# Patient Record
Sex: Male | Born: 1985 | Race: Black or African American | Hispanic: No | Marital: Single | Smoking: Current every day smoker
Health system: Southern US, Community
[De-identification: ages and names within clinical notes are randomized; demographics above are authoritative.]

## PROBLEM LIST (undated history)

## (undated) DIAGNOSIS — G809 Cerebral palsy, unspecified: Secondary | ICD-10-CM

## (undated) DIAGNOSIS — I639 Cerebral infarction, unspecified: Secondary | ICD-10-CM

## (undated) DIAGNOSIS — I10 Essential (primary) hypertension: Secondary | ICD-10-CM

## (undated) DIAGNOSIS — F32A Depression, unspecified: Secondary | ICD-10-CM

## (undated) DIAGNOSIS — F329 Major depressive disorder, single episode, unspecified: Secondary | ICD-10-CM

---
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
## 2006-05-18 ENCOUNTER — Emergency Department (HOSPITAL_COMMUNITY): Admission: EM | Admit: 2006-05-18 | Discharge: 2006-05-18 | Payer: Self-pay | Admitting: Emergency Medicine

## 2007-08-10 ENCOUNTER — Emergency Department (HOSPITAL_COMMUNITY): Admission: EM | Admit: 2007-08-10 | Discharge: 2007-08-10 | Payer: Self-pay | Admitting: Emergency Medicine

## 2008-10-24 ENCOUNTER — Emergency Department (HOSPITAL_COMMUNITY): Admission: EM | Admit: 2008-10-24 | Discharge: 2008-10-24 | Payer: Self-pay | Admitting: Emergency Medicine

## 2008-10-26 ENCOUNTER — Inpatient Hospital Stay (HOSPITAL_COMMUNITY): Admission: EM | Admit: 2008-10-26 | Discharge: 2008-10-28 | Payer: Self-pay | Admitting: Emergency Medicine

## 2009-02-13 ENCOUNTER — Emergency Department (HOSPITAL_COMMUNITY): Admission: EM | Admit: 2009-02-13 | Discharge: 2009-02-13 | Payer: Self-pay | Admitting: Emergency Medicine

## 2009-04-13 ENCOUNTER — Emergency Department (HOSPITAL_COMMUNITY): Admission: EM | Admit: 2009-04-13 | Discharge: 2009-04-13 | Payer: Self-pay | Admitting: Emergency Medicine

## 2010-07-07 LAB — COMPREHENSIVE METABOLIC PANEL
ALT: 40 U/L (ref 0–53)
Alkaline Phosphatase: 57 U/L (ref 39–117)
CO2: 25 mEq/L (ref 19–32)
Calcium: 9.3 mg/dL (ref 8.4–10.5)
Chloride: 108 mEq/L (ref 96–112)
GFR calc non Af Amer: >60 mL/min (ref >60)
Glucose, Bld: 205 mg/dL — ABNORMAL HIGH (ref 70–99)
Potassium: 4.2 mEq/L (ref 3.5–5.1)
Sodium: 141 mEq/L (ref 135–145)
Total Bilirubin: 0.7 mg/dL (ref 0.3–1.2)

## 2010-07-07 LAB — CBC
Hemoglobin: 14.1 g/dL (ref 13.0–17.0)
Hemoglobin: 14.1 g/dL (ref 13.0–17.0)
MCHC: 32.7 g/dL (ref 30.0–36.0)
MCHC: 33.6 g/dL (ref 30.0–36.0)
RBC: 4.95 MIL/uL (ref 4.22–5.81)
RBC: 5.02 MIL/uL (ref 4.22–5.81)
RBC: 5.2 MIL/uL (ref 4.22–5.81)
RDW: 15.2 % (ref 11.5–15.5)
WBC: 11 10*3/uL — ABNORMAL HIGH (ref 4.0–10.5)
WBC: 16.3 10*3/uL — ABNORMAL HIGH (ref 4.0–10.5)

## 2010-07-07 LAB — URINALYSIS, ROUTINE W REFLEX MICROSCOPIC
Bilirubin Urine: NEGATIVE
Hgb urine dipstick: NEGATIVE
Ketones, ur: NEGATIVE mg/dL
Protein, ur: NEGATIVE mg/dL
Urobilinogen, UA: 1 mg/dL (ref 0.0–1.0)

## 2010-07-07 LAB — SEDIMENTATION RATE: Sed Rate: 5 mm/hr (ref 0–16)

## 2010-07-07 LAB — RHEUMATOID FACTOR: Rhuematoid fact SerPl-aCnc: <20 IU/mL (ref 0–20)

## 2010-07-07 LAB — LACTATE DEHYDROGENASE: LDH: 143 U/L (ref 94–250)

## 2010-07-07 LAB — DIFFERENTIAL
Basophils Relative: 0 % (ref 0–1)
Monocytes Relative: 4 % (ref 3–12)
Neutro Abs: 8 10*3/uL — ABNORMAL HIGH (ref 1.7–7.7)
Neutrophils Relative %: 73 % (ref 43–77)

## 2010-07-07 LAB — MAGNESIUM: Magnesium: 2 mg/dL (ref 1.5–2.5)

## 2010-08-13 NOTE — H&P (Signed)
Troy Phillips, WILDEN NO.:  [id number]   MEDICAL RECORD NO.:  [id number]          PATIENT TYPE:  INP   LOCATION:  1318                         FACILITY:  [facility]   PHYSICIAN:  Joylene John, MD       DATE OF BIRTH:  21-Apr-1985   DATE OF ADMISSION:  10/26/2008  DATE OF DISCHARGE:                              HISTORY & PHYSICAL   REASON FOR ADMISSION:  Facial swelling.   HISTORY OF PRESENT ILLNESS:  This is a 25 year old African American male  who came to the ER two days ago on October 19, 2008, with similar  complaints with right-sided facial swelling which started suddenly.  The  patient was treated as an allergic reaction, given steroids and  Benadryl, and discharged home.  The patient was told to come back if the  symptoms got worse.  The patient tells me that he has been taking the  medication.  However, the symptoms have gotten worse and he has had no  relief.  According to the patient and the mother, the swelling has  actually spread forward from the right side of the face to the left side  and the right side is almost shut.  He also has been developing hives  which have started as of today of the torso.  The patient denies any new  contacts to allergens such as soap, detergent, or perfumes.  He has had  no recent travels, no new clothes, and no new sheets.  He does have a  girlfriend who also has not had any new allergens such as perfumes or  detergents.  He has not had any similar episodes in the past.  He does  complain of associated burning and itching.  According to the mother,  the girlfriend has noticed that he has been having night sweats for the  last 3-4 days.  The patient denies any chills, nausea, or vomiting.  He  does have asthma.  However, per the mother, he does not take medication.  The patient has some mild shortness of breath due to his asthma.  GI:  He has no problems swallowing and no problems with bowel movements.   PAST MEDICAL HISTORY:  1. Cerebral palsy.  2. Asthma.  3. Hypertension.   FAMILY HISTORY:  No family history of allergic reactions.   SOCIAL HISTORY:  He lives with his mother.  He has a girlfriend.  No  alcohol, drugs, or tobacco.   LABORATORY DATA:  Only a CBC was done in the ER which shows a white  count of 11, hemoglobin 14.8, hematocrit 44, platelets 274,000.  No  chemistry or chest x-ray were done in the ER.   PHYSICAL EXAMINATION:  VITAL SIGNS:  Temperature of 97.4, pulse 77,  respirations 20, blood pressure 140/92, and the patient is saturating  98% on room air.  GENERAL:  This is an [place] American male in no acute distress, awake  and alert, family at bedside.  HEENT: He does have the right side of the face which is swollen with his  right eye almost shut.  He does not  have any corneal involvement.  Rash  is spreading to his left side of the face.  He also has hives on the  torso.  CARDIOVASCULAR:  Regular rate and rhythm.  LUNGS:  He has bilateral expiratory wheezes.  LOWER EXTREMITIES: No edema appreciated.   ASSESSMENT AND PLAN:  The plan is to admit the patient and put him on  high-dose steroids for 24 hours and then reevaluate by the MD in the  morning.  Will also put him on p.r.n. Benadryl and Atarax for his  burning and  itching.  We will put him on H2 blocker and repeat the labs in the  morning including a chemistry and get a chest x-ray in the morning as  well.  If there is no improvement then to consider dermatology consult  and evaluation in the morning.      Joylene John, MD  Electronically Signed     RP/MEDQ  D:  10/26/2008  T:  10/27/2008  Job:  [id number]

## 2010-08-13 NOTE — Discharge Summary (Signed)
Troy Phillips, Troy Phillips NO.:  [id number]   MEDICAL RECORD NO.:  [id number]          PATIENT TYPE:  INP   LOCATION:  1318                         FACILITY:  [facility]   PHYSICIAN:  Hillery Aldo, M.D.   DATE OF BIRTH:  22-Nov-1985   DATE OF ADMISSION:  10/26/2008  DATE OF DISCHARGE:  10/28/2008                               DISCHARGE SUMMARY   PRIMARY CARE PHYSICIAN:  Lonia Blood, M.D.   ALLERGIST:  None.   The patient will be referred to [name], M.D. for hospital  followup.   DISCHARGE DIAGNOSES:  1. Angioedema.  2. Hives.  3. Cerebral palsy.  4. History of asthma.  5. History of hypertension.  6. Steroid-induced hyperglycemia.  7. Leukocytosis, likely steroid induced.   DISCHARGE MEDICATIONS:  1. Prednisone 60 mg daily.  2. Zyrtec 10 mg daily.  3. Pepcid 20 mg b.i.d.  4. Epipen p.r.n.  5. Benadryl cream to hives q.4-6 h. p.r.n. pruritus.   CONSULTATIONS:  None.   BRIEF ADMISSION HISTORY OF PRESENT ILLNESS:  The patient is a 25-year-  old male who came to the emergency department on October 24, 2008 with a  chief complaint of facial swelling.  He was given steroids and  discharged home on prednisone.  Despite taking these medications, had no  relief of symptoms and subsequently represented to the hospital on October 26, 2008 where he was referred to the Hospitalist Service for admission.  The patient had no new contacts with any environmental allergens.  He  has no known drug allergies.  He subsequently was admitted for high-dose  IV Solu-Medrol.  For the full details, please see the dictated report  done by Dr. Allena Katz.   PROCEDURES/DIAGNOSTIC STUDIES:  Chest x-ray on October 26, 2008 showed no  acute cardiopulmonary disease.   LABORATORY DATA:  Discharge laboratory values:  CRP was 0.1.  ESR was 5.  White blood cell count was 19.5, hemoglobin 14.1, hematocrit 42.2,  platelets 300.  Chemistries on admission were unremarkable with the  exception of an  elevated glucose.   HOSPITAL COURSE:  1. Angioedema with hives:  The patient clearly has had an allergic      reaction to an environmental antigen of unknown etiology.  Because      of the severity of his reaction, the patient will be maintained on      prednisone until he follows up with an allergist.  He will also be      prescribed Zyrtec and Pepcid as well as an EpiPen to use if he has      any exacerbation of symptoms with strict instructions that if he      has to use the EpiPen, that he should return to the emergency      department or call 911 immediately.  The patient verbalized      understanding of these instructions.  The patient's facial swelling      has responded to high dose Solu-Medrol.  He will be discharged on      prednisone as noted above.  The patient is further instructed to  avoid going outdoors until his symptoms completely resolve given      that he thinks going outdoors has made his symptoms worse.  2. Cerebral palsy:  No active issues during this hospital stay.  3. History of asthma:  The patient's lung sounds are clear.  4. History of hypertension:  The patient is not currently on any      medications.  He should follow up with Dr. [name] for routine      surveillance and consideration of treatment if indicated.  5. Steroid-induced hyperglycemia:  This should resolve with      discontinuation of steroids.  6. Leukocytosis:  This is most clearly steroid-induced.  His white      blood cell count was 11 on original presentation.  The patient does      not have any signs or symptoms of active infection.   DISPOSITION:  The patient is medically stable and will be discharged  home.   FOLLOW UP:  He is encouraged to follow up with his primary care  physician as well as with Dr. [name] next week.  The telephone number to  Dr. [name] office has been provided to the patient.   Time spent coordinating care for discharge and discharge instructions  equals 35  minutes.      Hillery Aldo, M.D.  Electronically Signed     CR/MEDQ  D:  10/28/2008  T:  10/28/2008  Job:  [id number]   cc:   Lonia Blood, M.D.   [name], M.D. [facility]  [address]  [place]  [place] [address]

## 2011-02-13 ENCOUNTER — Emergency Department (HOSPITAL_COMMUNITY)
Admission: EM | Admit: 2011-02-13 | Discharge: 2011-02-13 | Disposition: A | Discharge status: Home / Self Care | Payer: Medicaid Other | Attending: Emergency Medicine | Admitting: Emergency Medicine

## 2011-02-13 ENCOUNTER — Encounter: Payer: Self-pay | Admitting: *Deleted

## 2011-02-13 ENCOUNTER — Emergency Department (HOSPITAL_COMMUNITY): Payer: Medicaid Other

## 2011-02-13 DIAGNOSIS — S99921A Unspecified injury of right foot, initial encounter: Secondary | ICD-10-CM

## 2011-02-13 DIAGNOSIS — M79609 Pain in unspecified limb: Secondary | ICD-10-CM | POA: Insufficient documentation

## 2011-02-13 DIAGNOSIS — Z79899 Other long term (current) drug therapy: Secondary | ICD-10-CM | POA: Insufficient documentation

## 2011-02-13 DIAGNOSIS — S99929A Unspecified injury of unspecified foot, initial encounter: Secondary | ICD-10-CM | POA: Insufficient documentation

## 2011-02-13 DIAGNOSIS — I10 Essential (primary) hypertension: Secondary | ICD-10-CM | POA: Insufficient documentation

## 2011-02-13 DIAGNOSIS — S8990XA Unspecified injury of unspecified lower leg, initial encounter: Secondary | ICD-10-CM | POA: Insufficient documentation

## 2011-02-13 DIAGNOSIS — J45909 Unspecified asthma, uncomplicated: Secondary | ICD-10-CM | POA: Insufficient documentation

## 2011-02-13 HISTORY — DX: Essential (primary) hypertension: I10

## 2011-02-13 MED ORDER — OXYCODONE-ACETAMINOPHEN 5-325 MG PO TABS
1.0000 | ORAL_TABLET | Freq: Once | ORAL | Status: AC
  Administered 2011-02-13: 1 via ORAL
  Filled 2011-02-13: qty 1

## 2011-02-13 MED ORDER — IBUPROFEN 800 MG PO TABS
800.0000 mg | ORAL_TABLET | Freq: Once | ORAL | Status: AC
  Administered 2011-02-13: 800 mg via ORAL
  Filled 2011-02-13: qty 1

## 2011-02-13 MED ORDER — OXYCODONE-ACETAMINOPHEN 5-325 MG PO TABS: 1.0000 | ORAL_TABLET | ORAL | Status: AC | PRN

## 2011-02-13 MED ORDER — IBUPROFEN 600 MG PO TABS: 600.0000 mg | ORAL_TABLET | Freq: Four times a day (QID) | ORAL | Status: AC | PRN

## 2011-02-13 NOTE — Progress Notes (Signed)
Orthopedic Tech Progress Note Patient Details:  Troy Phillips November 11, 1985 [id number]  Other Ortho Devices Type of Ortho Device: Postop boot Ortho Device Location: (R) LE Ortho Device Interventions: Application   Jennye Moccasin 02/13/2011, 9:45 PM

## 2011-02-13 NOTE — ED Provider Notes (Signed)
Medical screening examination/treatment/procedure(s) were performed by non-physician practitioner and as supervising physician I was immediately available for consultation/collaboration.   Zoriyah Scheidegger R. Americus Perkey, MD 02/13/11 2340 
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
## 2011-04-23 ENCOUNTER — Ambulatory Visit (INDEPENDENT_AMBULATORY_CARE_PROVIDER_SITE_OTHER): Payer: Medicaid Other | Admitting: Neurology

## 2011-04-23 ENCOUNTER — Encounter: Payer: Self-pay | Admitting: Neurology

## 2011-04-23 VITALS — BP 118/80 | HR 68 | Ht 75.0 in | Wt 311.0 lb

## 2011-04-23 DIAGNOSIS — G809 Cerebral palsy, unspecified: Secondary | ICD-10-CM
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
## 2011-04-25 DIAGNOSIS — G809 Cerebral palsy, unspecified: Secondary | ICD-10-CM | POA: Insufficient documentation

## 2011-08-30 ENCOUNTER — Emergency Department (HOSPITAL_COMMUNITY)
Admission: EM | Admit: 2011-08-30 | Discharge: 2011-08-30 | Disposition: A | Discharge status: Home / Self Care | Payer: Self-pay | Attending: Emergency Medicine | Admitting: Emergency Medicine

## 2011-08-30 ENCOUNTER — Encounter (HOSPITAL_COMMUNITY): Payer: Self-pay

## 2011-08-30 ENCOUNTER — Emergency Department (HOSPITAL_COMMUNITY): Payer: Self-pay

## 2011-08-30 DIAGNOSIS — R51 Headache: Secondary | ICD-10-CM | POA: Insufficient documentation

## 2011-08-30 DIAGNOSIS — S0990XA Unspecified injury of head, initial encounter: Secondary | ICD-10-CM

## 2011-08-30 DIAGNOSIS — S0003XA Contusion of scalp, initial encounter: Secondary | ICD-10-CM | POA: Insufficient documentation

## 2011-08-30 DIAGNOSIS — H539 Unspecified visual disturbance: Secondary | ICD-10-CM | POA: Insufficient documentation

## 2011-08-30 MED ORDER — HYDROCODONE-ACETAMINOPHEN 5-500 MG PO TABS: 1.0000 | ORAL_TABLET | Freq: Four times a day (QID) | ORAL | Status: AC | PRN

## 2011-08-30 MED ORDER — HYDROCODONE-ACETAMINOPHEN 5-325 MG PO TABS
2.0000 | ORAL_TABLET | Freq: Once | ORAL | Status: AC
  Administered 2011-08-30: 2 via ORAL
  Filled 2011-08-30: qty 2

## 2011-08-30 NOTE — ED Notes (Signed)
Patient transported to CT 
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
## 2011-08-30 NOTE — ED Notes (Signed)
[employer] in to take photos of patient's injurys

## 2011-08-30 NOTE — Discharge Instructions (Signed)
Assault, General Assault includes any behavior, whether intentional or reckless, which results in bodily injury to another person and/or damage to property. Included in this would be any behavior, intentional or reckless, that by its nature would be understood (interpreted) by a reasonable person as intent to harm another person or to damage his/her property. Threats may be oral or written. They may be communicated through regular mail, computer, fax, or phone. These threats may be direct or implied. FORMS OF ASSAULT INCLUDE:  Physically assaulting a person. This includes physical threats to inflict physical harm as well as:   Slapping.   Hitting.   Poking.   Kicking.   Punching.   Pushing.   Arson.   Sabotage.   Equipment vandalism.   Damaging or destroying property.   Throwing or hitting objects.   Displaying a weapon or an object that appears to be a weapon in a threatening manner.   Carrying a firearm of any kind.   Using a weapon to harm someone.   Using greater physical size/strength to intimidate another.   Making intimidating or threatening gestures.   Bullying.   Hazing.   Intimidating, threatening, hostile, or abusive language directed toward another person.   It communicates the intention to engage in violence against that person. And it leads a reasonable person to expect that violent behavior may occur.   Stalking another person.  IF IT HAPPENS AGAIN:  Immediately call for emergency help (911 in [place]   If someone poses clear and immediate danger to you, seek legal authorities to have a protective or restraining order put in place.   Less threatening assaults can at least be reported to authorities.  STEPS TO TAKE IF A SEXUAL ASSAULT HAS HAPPENED  Go to an area of safety. This may include a shelter or staying with a friend. Stay away from the area where you have been attacked. A large percentage of sexual assaults are caused by a friend, relative  or associate.   If medications were given by your caregiver, take them as directed for the full length of time prescribed.   Only take over-the-counter or prescription medicines for pain, discomfort, or fever as directed by your caregiver.   If you have come in contact with a sexual disease, find out if you are to be tested again. If your caregiver is concerned about the HIV/AIDS virus, he/she may require you to have continued testing for several months.   For the protection of your privacy, test results can not be given over the phone. Make sure you receive the results of your test. If your test results are not back during your visit, make an appointment with your caregiver to find out the results. Do not assume everything is normal if you have not heard from your caregiver or the medical facility. It is important for you to follow up on all of your test results.   File appropriate papers with authorities. This is important in all assaults, even if it has occurred in a family or by a friend.  SEEK MEDICAL CARE IF:  You have new problems because of your injuries.   You have problems that may be because of the medicine you are taking, such as:   Rash.   Itching.   Swelling.   Trouble breathing.   You develop belly (abdominal) pain, feel sick to your stomach (nausea) or are vomiting.   You begin to run a temperature.   You need supportive care or referral to  need supportive care or referral to a rape crisis center. These are centers with trained personnel who can help you get through this ordeal.  SEEK IMMEDIATE MEDICAL CARE IF:   You are afraid of being threatened, beaten, or abused. In [place] call 911.   You receive new injuries related to abuse.   You develop severe pain in any area injured in the assault or have any change in your condition that concerns you.   You faint or lose consciousness.   You develop chest pain or shortness of breath.  Document Released: 03/17/2005 Document Revised: 03/06/2011 Document Reviewed: 11/03/2007  [employer] Patient  Information 2012 [employer]

## 2012-10-16 ENCOUNTER — Encounter (HOSPITAL_COMMUNITY): Payer: Self-pay | Admitting: *Deleted

## 2012-10-16 DIAGNOSIS — E876 Hypokalemia: Secondary | ICD-10-CM | POA: Insufficient documentation

## 2012-10-16 DIAGNOSIS — J45901 Unspecified asthma with (acute) exacerbation: Principal | ICD-10-CM | POA: Insufficient documentation

## 2012-10-16 DIAGNOSIS — R0602 Shortness of breath: Secondary | ICD-10-CM | POA: Insufficient documentation

## 2012-10-16 DIAGNOSIS — I10 Essential (primary) hypertension: Secondary | ICD-10-CM | POA: Insufficient documentation

## 2012-10-16 MED ORDER — ALBUTEROL SULFATE (5 MG/ML) 0.5% IN NEBU
5.0000 mg | INHALATION_SOLUTION | Freq: Once | RESPIRATORY_TRACT | Status: AC
  Administered 2012-10-16: 5 mg via RESPIRATORY_TRACT
  Filled 2012-10-16: qty 1

## 2012-10-16 MED ORDER — IPRATROPIUM BROMIDE 0.02 % IN SOLN
0.5000 mg | Freq: Once | RESPIRATORY_TRACT | Status: AC
  Administered 2012-10-16: 0.5 mg via RESPIRATORY_TRACT
  Filled 2012-10-16: qty 2.5

## 2012-10-16 NOTE — ED Notes (Signed)
Sob for 2 days he has asthma.  He has inhalers at home but he lost them.   Audible wheezes

## 2012-10-17 ENCOUNTER — Emergency Department (HOSPITAL_COMMUNITY)
Admit: 2012-10-17 | Discharge: 2012-10-17 | Disposition: A | Discharge status: Home / Self Care | Payer: Medicaid Other | Attending: Emergency Medicine | Admitting: Emergency Medicine

## 2012-10-17 ENCOUNTER — Observation Stay (HOSPITAL_COMMUNITY)
Admission: EM | Admit: 2012-10-17 | Discharge: 2012-10-18 | Disposition: A | Discharge status: Home / Self Care | Payer: Medicaid Other | Attending: Internal Medicine | Admitting: Internal Medicine

## 2012-10-17 DIAGNOSIS — J45901 Unspecified asthma with (acute) exacerbation: Secondary | ICD-10-CM

## 2012-10-17 DIAGNOSIS — J45909 Unspecified asthma, uncomplicated: Secondary | ICD-10-CM

## 2012-10-17 DIAGNOSIS — R6510 Systemic inflammatory response syndrome (SIRS) of non-infectious origin without acute organ dysfunction: Secondary | ICD-10-CM

## 2012-10-17 DIAGNOSIS — I10 Essential (primary) hypertension: Secondary | ICD-10-CM | POA: Diagnosis present

## 2012-10-17 LAB — POCT I-STAT 3, VENOUS BLOOD GAS (G3P V)
Bicarbonate: 25.4 mEq/L — ABNORMAL HIGH (ref 20.0–24.0)
TCO2: 27 mmol/L (ref 0–100)
pCO2, Ven: 41.5 mmHg — ABNORMAL LOW (ref 45.0–50.0)
pH, Ven: 7.395 — ABNORMAL HIGH (ref 7.250–7.300)
pO2, Ven: 145 mmHg — ABNORMAL HIGH (ref 30.0–45.0)

## 2012-10-17 LAB — BASIC METABOLIC PANEL
CO2: 25 mEq/L (ref 19–32)
Chloride: 101 mEq/L (ref 96–112)
Creatinine, Ser: 0.96 mg/dL (ref 0.50–1.35)
Potassium: 3.2 mEq/L — ABNORMAL LOW (ref 3.5–5.1)

## 2012-10-17 LAB — CBC WITH DIFFERENTIAL/PLATELET
Basophils Absolute: 0 10*3/uL (ref 0.0–0.1)
HCT: 41.4 % (ref 39.0–52.0)
Hemoglobin: 14.4 g/dL (ref 13.0–17.0)
Lymphocytes Relative: 11 % — ABNORMAL LOW (ref 12–46)
Monocytes Absolute: 0.4 10*3/uL (ref 0.1–1.0)
Neutro Abs: 10.6 10*3/uL — ABNORMAL HIGH (ref 1.7–7.7)
Neutrophils Relative %: 85 % — ABNORMAL HIGH (ref 43–77)
RDW: 14.5 % (ref 11.5–15.5)
WBC: 12.5 10*3/uL — ABNORMAL HIGH (ref 4.0–10.5)

## 2012-10-17 MED ORDER — ONDANSETRON HCL 4 MG/2ML IJ SOLN: 4.0000 mg | Freq: Four times a day (QID) | INTRAMUSCULAR | Status: DC | PRN

## 2012-10-17 MED ORDER — ENOXAPARIN SODIUM 40 MG/0.4ML ~~LOC~~ SOLN
40.0000 mg | SUBCUTANEOUS | Status: DC
  Filled 2012-10-17 (×2): qty 0.4

## 2012-10-17 MED ORDER — ALBUTEROL SULFATE (5 MG/ML) 0.5% IN NEBU
5.0000 mg | INHALATION_SOLUTION | Freq: Once | RESPIRATORY_TRACT | Status: AC
  Administered 2012-10-17: 5 mg via RESPIRATORY_TRACT
  Filled 2012-10-17: qty 1

## 2012-10-17 MED ORDER — ALBUTEROL SULFATE (5 MG/ML) 0.5% IN NEBU: 2.5000 mg | INHALATION_SOLUTION | RESPIRATORY_TRACT | Status: DC | PRN

## 2012-10-17 MED ORDER — SERTRALINE HCL 50 MG PO TABS
50.0000 mg | ORAL_TABLET | Freq: Every day | ORAL | Status: DC
  Administered 2012-10-18: 50 mg via ORAL
  Filled 2012-10-17 (×2): qty 1

## 2012-10-17 MED ORDER — ALBUTEROL SULFATE (5 MG/ML) 0.5% IN NEBU
2.5000 mg | INHALATION_SOLUTION | RESPIRATORY_TRACT | Status: DC
  Administered 2012-10-17 (×3): 2.5 mg via RESPIRATORY_TRACT
  Filled 2012-10-17 (×2): qty 0.5

## 2012-10-17 MED ORDER — ACETAMINOPHEN 650 MG RE SUPP: 650.0000 mg | Freq: Four times a day (QID) | RECTAL | Status: DC | PRN

## 2012-10-17 MED ORDER — ACETAMINOPHEN 325 MG PO TABS: 650.0000 mg | ORAL_TABLET | Freq: Four times a day (QID) | ORAL | Status: DC | PRN

## 2012-10-17 MED ORDER — IPRATROPIUM BROMIDE 0.02 % IN SOLN
0.5000 mg | Freq: Four times a day (QID) | RESPIRATORY_TRACT | Status: DC
  Administered 2012-10-18 (×2): 0.5 mg via RESPIRATORY_TRACT
  Filled 2012-10-17 (×2): qty 2.5

## 2012-10-17 MED ORDER — IPRATROPIUM BROMIDE 0.02 % IN SOLN
0.5000 mg | Freq: Once | RESPIRATORY_TRACT | Status: AC
  Administered 2012-10-17: 0.5 mg via RESPIRATORY_TRACT
  Filled 2012-10-17 (×2): qty 2.5

## 2012-10-17 MED ORDER — ALBUTEROL SULFATE (5 MG/ML) 0.5% IN NEBU
5.0000 mg | INHALATION_SOLUTION | Freq: Once | RESPIRATORY_TRACT | Status: AC
  Administered 2012-10-17: 5 mg via RESPIRATORY_TRACT
  Filled 2012-10-17 (×2): qty 1

## 2012-10-17 MED ORDER — ONDANSETRON HCL 4 MG PO TABS: 4.0000 mg | ORAL_TABLET | Freq: Four times a day (QID) | ORAL | Status: DC | PRN

## 2012-10-17 MED ORDER — ALBUTEROL SULFATE (5 MG/ML) 0.5% IN NEBU
INHALATION_SOLUTION | RESPIRATORY_TRACT | Status: AC
  Administered 2012-10-17: 2.5 mg via RESPIRATORY_TRACT
  Filled 2012-10-17: qty 0.5

## 2012-10-17 MED ORDER — IPRATROPIUM BROMIDE 0.02 % IN SOLN
0.5000 mg | Freq: Once | RESPIRATORY_TRACT | Status: AC
  Administered 2012-10-17: 0.5 mg via RESPIRATORY_TRACT
  Filled 2012-10-17: qty 2.5

## 2012-10-17 MED ORDER — PREDNISONE 50 MG PO TABS
60.0000 mg | ORAL_TABLET | Freq: Every day | ORAL | Status: DC
  Administered 2012-10-17 – 2012-10-18 (×2): 60 mg via ORAL
  Filled 2012-10-17 (×3): qty 1

## 2012-10-17 MED ORDER — ALBUTEROL SULFATE (5 MG/ML) 0.5% IN NEBU
2.5000 mg | INHALATION_SOLUTION | Freq: Four times a day (QID) | RESPIRATORY_TRACT | Status: DC
  Administered 2012-10-18 (×2): 2.5 mg via RESPIRATORY_TRACT
  Filled 2012-10-17 (×2): qty 0.5

## 2012-10-17 MED ORDER — PREDNISONE 20 MG PO TABS
60.0000 mg | ORAL_TABLET | Freq: Once | ORAL | Status: AC
  Administered 2012-10-17: 60 mg via ORAL
  Filled 2012-10-17: qty 3

## 2012-10-17 NOTE — ED Provider Notes (Signed)
History    CSN: [id number] Arrival date & time 10/16/12  2335  First MD Initiated Contact with Patient 10/17/12 0403     Chief Complaint  Patient presents with  . Shortness of Breath   (Consider location/radiation/quality/duration/timing/severity/associated sxs/prior Treatment) HPI  Patient is a 27 yo man with asthma who is brought to the ED by family. He has had two days of wheezing and SOB. Sx have been increasingly severe. Denies chest pain. Has had a minimally productive cough. No chest pain or fever. Has been out of Albuterol MDI for months. Smokes 2-3 cigs/day. Denies history of admission for asthma.   Patient also has currently untreated HTN.  Past Medical History  Diagnosis Date  . Hypertension   . Asthma    History reviewed. No pertinent past surgical history. No family history on file. History  Substance Use Topics  . Smoking status: Never Smoker   . Smokeless tobacco: Never Used  . Alcohol Use: No    Review of Systems Gen: no weight loss, fevers, chills, night sweats Eyes: no discharge or drainage, no occular pain or visual changes Nose: no epistaxis or rhinorrhea Mouth: no dental pain, no sore throat Neck: no neck pain Lungs: As per history of present illness, otherwise negative CV: no chest pain, palpitations, dependent edema or orthopnea Abd: no abdominal pain, nausea, vomiting GU: no dysuria or gross hematuria MSK: no myalgias or arthralgias Neuro: no headache, no focal neurologic deficits Skin: no rash Psyche: negative.  Allergies  Review of patient's allergies indicates no known allergies.  Home Medications   Current Outpatient Rx  Name  Route  Sig  Dispense  Refill  . albuterol (PROVENTIL HFA;VENTOLIN HFA) 108 (90 BASE) MCG/ACT inhaler   Inhalation   Inhale 2 puffs into the lungs every 6 (six) hours as needed. Shortness of breath.          Marland Kitchen amLODipine (NORVASC) 10 MG tablet   Oral   Take 10 mg by mouth daily.          .  hydrochlorothiazide (HYDRODIURIL) 25 MG tablet   Oral   Take 25 mg by mouth daily.          . sertraline (ZOLOFT) 50 MG tablet   Oral   Take 50 mg by mouth daily.          BP 149/86  Pulse 111  Temp(Src) 98.4 F (36.9 C)  Resp 28  SpO2 89% Physical Exam Gen: well developed and well nourished appearing Head: NCAT Eyes: PERL, EOMI Nose: no epistaixis or rhinorrhea Mouth/throat: mucosa is moist and pink Neck: supple, no stridor Lungs: BS diminished bilaterally, RR 32/min, diffuse high pitched wheezing all fields CV: rapid and regular, no murmur Abd: soft, notender, nondistended Back: no ttp, no cva ttp Skin: no rashese, wnl Neuro: CN ii-xii grossly intact, no focal deficits Psyche; normal affect,  calm and cooperative.   ED Course  Procedures (including critical care time) Labs Reviewed - No data to display Dg Chest 2 View  10/17/2012   *RADIOLOGY REPORT*  Clinical Data: Chest pain  CHEST - 2 VIEW  Comparison: Prior radiograph from 02/13/2009  Findings: The cardiac and mediastinal silhouettes are stable in size and contour, and remain within normal limits.  The lungs are normally inflated.  There is no airspace consolidation, pleural effusion, or pulmonary edema. No pneumothorax.  The osseous structures and soft tissues are unremarkable.  IMPRESSION: No acute cardiopulmonary process.   Original Report Authenticated By: Rise Mu, M.D.  No diagnosis found.  MDM  Patient with acute asthma excacerbation. Presented hypoxic and tachycardic. IMproved after duoneb but still has diffuse wheezing and poor air exchange with RA sats of 95% at rest. We are going to treat with Albuterol 5mg  SVN and prednisone 60mg  po and re-evaluate.   CXR - no acute process.   1610: Patient with persistent wheezing despite multiple nebs and oral steroid. His peak flow is and predicted is 600 mL - patient does not have a baseline.  Resting sats are around 94% but, with ambulation, the  patient desats to 87%.  Thus, the patient requires admission.  IM paged.   Anticipate d/c home. We will refill patient's BP meds. I counseled him at length regarding the importance of medical compliance and follow up care.   CRITICAL CARE Performed by: Brandt Loosen   Total critical care time: 88m  Critical care time was exclusive of separately billable procedures and treating other patients.  Critical care was necessary to treat or prevent imminent or life-threatening deterioration.  Critical care was time spent personally by me on the following activities: development of treatment plan with patient and/or surrogate as well as nursing, discussions with consultants, evaluation of patient's response to treatment, examination of patient, obtaining history from patient or surrogate, ordering and performing treatments and interventions, ordering and review of laboratory studies, ordering and review of radiographic studies, pulse oximetry and re-evaluation of patient's condition.   Brandt Loosen, MD 10/17/12 [phone]

## 2012-10-17 NOTE — H&P (Signed)
Date: 10/17/2012               Patient Name:  Troy Phillips MRN: [id number]  DOB: 27-Dec-1985 Age / Sex: 27 y.o., male   PCP: Rometta Emery, MD         Medical Service: Internal Medicine Teaching Service         Attending Physician: Dr. Josem Kaufmann    First Contact: Dr. Darci Needle Pager: [phone]  Second Contact: Dr. Manson Passey Pager: [phone]       After Hours (After 5p/  First Contact Pager: [phone]  weekends / holidays): Second Contact Pager: [phone]   Chief Complaint: SOB  History of Present Illness:  This is a 27yo AA man with h/o HTN and asthma who presents with SOB and cough x 4 days. Patient reports having worsening exertional SOB and a cough productive of yellow-green sputum with blood streaks. When patient arrived in ED he was tachycardic and hypoxic at rest (89%) with peak flow of 180 (predicted PEFR 644). After receiving 4 albuterol and 3 ipratropium nebulizers in ED, his PEFR improved to 340 and SpO2 was 94% at rest, but 87% with ambulation. Patient reports that he had some chest tightness yesterday when he arrived in the ED, but this resolved with the nebulizer treatments in the ED. Patient also endorses having sore throat and some cold sweats yesterday prior to arrival in ED. He lost his albuterol inhaler 2 weeks ago, but reports that at baseline he usually uses his inhaler twice weekly. He has no h/o previous intubation or other admission for asthma. He has never used oral or inhaled steroids, has only been managed on an albuterol inhaler in the past. Denies N/V/D, abd pain, fever. He has not been sick recently and has had no sick contacts. 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 Of note, patient also received a CXR in ED, which showed no acute process as well as a VBG, which was wnl.   Meds: No current facility-administered medications for this encounter.   Current Outpatient Prescriptions  Medication Sig Dispense Refill    . albuterol (PROVENTIL HFA;VENTOLIN HFA) 108 (90 BASE) MCG/ACT inhaler Inhale 2 puffs into the lungs every 6 (six) hours as needed. Shortness of breath.       Marland Kitchen amLODipine (NORVASC) 10 MG tablet Take 10 mg by mouth daily.       . hydrochlorothiazide (HYDRODIURIL) 25 MG tablet Take 25 mg by mouth daily.       . sertraline (ZOLOFT) 50 MG tablet Take 50 mg by mouth daily.      patient has been out of his medications for 2 weeks.   Allergies: Allergies as of 10/16/2012  . (No Known Allergies)   Past Medical History  Diagnosis Date  . Hypertension   . Asthma    History reviewed. No pertinent past surgical history. No family history on file. History   Social History  . Marital Status: Single    Spouse Name: N/A    Number of Children: N/A  . Years of Education: N/A   Occupational History  . Not on file.   Social History Main Topics  . Smoking status: Never Smoker   . Smokeless tobacco: Never Used  . Alcohol Use: No  . Drug Use: Not on file  . Sexually Active: Not on file   Other Topics Concern  . Not on file   Social History Narrative  .  No narrative on file   Review of Systems: General: +cold sweats; no fevers, changes in weight, changes in appetite Skin: no rash HEENT: no blurry vision, hearing changes, sore throat Pulm: See HPI CV: See HPI Abd: no abdominal pain, nausea/vomiting, diarrhea/constipation GU: no dysuria, hematuria, polyuria Ext: no arthralgias, myalgias Neuro: no weakness, numbness, or tingling  Physical Exam: Blood pressure 125/65, pulse 88, temperature 98.4 F (36.9 C), resp. rate 20, SpO2 96.00%. General: alert, cooperative, and in no apparent distress HEENT: pupils equal round and reactive to light, vision grossly intact, no pharyngeal exudate, but with mild erythema; mucous membranes appear dry Neck: supple, no lymphadenopathy Lungs: slight increase in WOB- no accessory muscle use, can speak in full sentences, inspiratory wheezes throughout  all lung fields and transmitted upper airway sounds and coughing on expiration Heart: regular rate and rhythm, no murmurs, gallops, or rubs Abdomen: soft, non-tender, non-distended, normal bowel sounds Extremities: warm extremities; no cyanosis, clubbing; no edema or pain to BLE Neurologic: alert & oriented X3, cranial nerves II-XII grossly intact, moves all extremities spontaneously, normal gross sensation to light touch  Lab results: Basic Metabolic Panel:  Recent Labs  08/65/78 0721  NA 137  K 3.2*  CL 101  CO2 25  GLUCOSE 129*  BUN 11  CREATININE 0.96  CALCIUM 9.5   CBC:  Recent Labs  10/17/12 0721  WBC 12.5*  NEUTROABS 10.6*  HGB 14.4  HCT 41.4  MCV 80.9  PLT 223   Venous blood gas: PH 7.39, pCO2 41.5, bicarb 25.4  Imaging results:  Dg Chest 2 View  10/17/2012   *RADIOLOGY REPORT*  Clinical Data: Chest pain  CHEST - 2 VIEW  Comparison: Prior radiograph from 02/13/2009  Findings: The cardiac and mediastinal silhouettes are stable in size and contour, and remain within normal limits.  The lungs are normally inflated.  There is no airspace consolidation, pleural effusion, or pulmonary edema. No pneumothorax.  The osseous structures and soft tissues are unremarkable.  IMPRESSION: No acute cardiopulmonary process.   Original Report Authenticated By: Rise Mu, M.D.   Assessment & Plan by Problem:  This is a 27yo man with h/o HTN and asthma who presents with SOB, chest tightness, PEFR <50% predicted c/w severe asthma exacerbation.   # Severe asthma exacerbation: Constellation of patient's symptoms, his normal CXR, PEFR <50% predicted that increased after tx with multiple albuterol/ipratropium nebs make severe asthma exacerbation the most probable diagnosis. Patient has improved significantly after the nebulizers, last PEFR 380 (predicted 644); however, he continues to desaturate to 87% with ambulation, so he will be admitted overnight for observation. -scheduled  albuterol neb q4h with albuterol neb q1h prn -repeat peak flow  -prednisone 60mg  po daily   # Hypokalemia: Patient with K 3.2 on admission. Likely due to albuterol that he received in ED. No need to replete at this time.   -repeat BMet in AM  # HTN: Patient is normotensive in ED. He has been off of his home medications (amlodipine 10mg  po daily and HCTZ 25mg  po daily) for two weeks. Will hold antihypertensives for now and restart as needed.  # VTE: lovenox  # Diet: regular  Code status: full  Dispo: Disposition is deferred at this time, awaiting improvement of current medical problems. Anticipated discharge in approximately 1 day(s).   The patient does have a current PCP (Rometta Emery, MD) and does not need an [facility] hospital follow-up appointment after discharge.  The patient does not have transportation limitations that hinder transportation  to clinic appointments.  Signed: Windell Hummingbird, MD 10/17/2012, 8:31 AM

## 2012-10-17 NOTE — ED Notes (Signed)
PEFR=180 pre treatment and 250 post treatment. Patients predicted PEFR is 644

## 2012-10-17 NOTE — Progress Notes (Signed)
PEFR pre treatment=360 post treatment=380 predicted PEFR=640

## 2012-10-17 NOTE — ED Notes (Signed)
Breakfast tray ordered for pt

## 2012-10-18 LAB — BASIC METABOLIC PANEL
CO2: 28 mEq/L (ref 19–32)
Calcium: 9.7 mg/dL (ref 8.4–10.5)
Glucose, Bld: 125 mg/dL — ABNORMAL HIGH (ref 70–99)
Potassium: 4.1 mEq/L (ref 3.5–5.1)
Sodium: 140 mEq/L (ref 135–145)

## 2012-10-18 MED ORDER — PREDNISONE 10 MG PO TABS: ORAL_TABLET | ORAL | Status: DC

## 2012-10-18 MED ORDER — ALBUTEROL SULFATE HFA 108 (90 BASE) MCG/ACT IN AERS: 2.0000 | INHALATION_SPRAY | RESPIRATORY_TRACT | Status: DC | PRN

## 2012-10-18 MED ORDER — ALBUTEROL SULFATE HFA 108 (90 BASE) MCG/ACT IN AERS
2.0000 | INHALATION_SPRAY | RESPIRATORY_TRACT | Status: DC | PRN
  Administered 2012-10-18: 2 via RESPIRATORY_TRACT
  Filled 2012-10-18: qty 6.7

## 2012-10-18 NOTE — Progress Notes (Signed)
Subjective: Patient is feeling much better, denies SOB and CP. He received 4 albuterol and 1 ipratropium nebs and 2 more doses of prednisone since his admission yesterday morning. Patient is no longer hypoxic with ambulation and has been off of supplemental O2 since last night. PEFR is 400 this morning, which is much improved from yesterday in the ED (180, predicted is 644). He feels ready to go home.  Objective: Vital signs in last 24 hours: Filed Vitals:   10/17/12 2026 10/17/12 2157 10/18/12 0222 10/18/12 0533  BP:  154/84  125/64  Pulse: 102 99 88 59  Temp:  98.3 F (36.8 C)  97.6 F (36.4 C)  Resp: 16 16 20 18   SpO2: 96% 94% 94% 96%   Weight change:   Intake/Output Summary (Last 24 hours) at 10/18/12 0651 Last data filed at 10/17/12 1747  Gross per 24 hour  Intake    600 ml  Output      0 ml  Net    600 ml   Physical Exam General: alert, cooperative, and in no apparent distress HEENT: pupils equal round and reactive to light, vision grossly intact; no pharyngeal exudate, but with mild erythema; MMM Neck: supple Lungs: no increased WOB; can speak in full sentences, expiratory wheezing throughout bilateral lung fields; + cough Heart: regular rate and rhythm, no murmurs, gallops, or rubs Abdomen: soft, non-tender, non-distended, normal bowel sounds  Extremities: warm extremities; no cyanosis, clubbing; no edema to BLE  Neurologic: alert & oriented X3, cranial nerves II-XII grossly intact, moves all extremities spontaneously, normal gross sensation to light touch  Lab Results: Basic Metabolic Panel:  Recent Labs Lab 10/17/12 0721  NA 137  K 3.2*  CL 101  CO2 25  GLUCOSE 129*  BUN 11  CREATININE 0.96  CALCIUM 9.5   CBC:  Recent Labs Lab 10/17/12 0721  WBC 12.5*  NEUTROABS 10.6*  HGB 14.4  HCT 41.4  MCV 80.9  PLT 223   Studies/Results: Dg Chest 2 View  10/17/2012   *RADIOLOGY REPORT*  Clinical Data: Chest pain  CHEST - 2 VIEW  Comparison: Prior  radiograph from 02/13/2009  Findings: The cardiac and mediastinal silhouettes are stable in size and contour, and remain within normal limits.  The lungs are normally inflated.  There is no airspace consolidation, pleural effusion, or pulmonary edema. No pneumothorax.  The osseous structures and soft tissues are unremarkable.  IMPRESSION: No acute cardiopulmonary process.   Original Report Authenticated By: Rise Mu, M.D.   Medications: I have reviewed the patient's current medications. Scheduled Meds: . albuterol  2.5 mg Nebulization Q6H  . enoxaparin (LOVENOX) injection  40 mg Subcutaneous Q24H  . ipratropium  0.5 mg Nebulization Q6H  . predniSONE  60 mg Oral Q breakfast  . sertraline  50 mg Oral Daily   Continuous Infusions:  PRN Meds:.acetaminophen, acetaminophen, albuterol, ondansetron (ZOFRAN) IV, ondansetron  Assessment/Plan: This is a 27yo man with h/o HTN and asthma who presents with SOB, chest tightness, PEFR <50% predicted c/w severe asthma exacerbation.   # Severe asthma exacerbation: Patient has improved significantly after the albuterol nebulizers, PEFR 400 this morning (predicted 644) and he is no longer hypoxic with ambulation (sat 97% on r/a with ambulation). Subjectively, patient is no longer SOB and feels ready to go home. He does need an albuterol inhaler to go home with.  -switch albuterol nebs to inhaler  -oral prednisone taper as follows: 40mg  for three days, 30 mg for three days, 20mg  for three  days, 10mg  for three days then stop.  # Hypokalemia: Patient with K 3.2 on admission. Likely due to albuterol that he received in ED, did not feel it was necessary to replete. -K 4.1 this morning  # HTN: Patient was normotensive in ED. He has been off of his home medications (amlodipine 10mg  po daily and HCTZ 25mg  po daily) for two weeks. Held antihypertensives while admitted, will restart upon discharge with follow up with PCP.  # VTE: lovenox   # Diet: regular    Code status: full  Dispo: discharge today.  The patient does have a current PCP (Rometta Emery, MD) and does not need an [facility] hospital follow-up appointment after discharge.  The patient does not have transportation limitations that hinder transportation to clinic appointments.  .Services Needed at time of discharge: Y = Yes, Blank = No PT:   OT:   RN:   Equipment:   Other:     LOS: 1 day   Windell Hummingbird, MD 10/18/2012, 6:51 AM

## 2012-10-18 NOTE — Discharge Summary (Signed)
Name: Troy Phillips MRN: [id number] DOB: Dec 18, 1985 27 y.o. PCP: Troy Emery, MD  Date of Admission: 10/17/2012  1:48 AM Date of Discharge: 10/18/2012 Attending Physician: Dr. Josem Phillips  Discharge Diagnosis: Principal Problem:   Acute asthma exacerbation Active Problems:   Hypertension  Discharge Medications:   Medication List         albuterol 108 (90 BASE) MCG/ACT inhaler  Commonly known as:  PROVENTIL HFA;VENTOLIN HFA  Inhale 2 puffs into the lungs every 4 (four) hours as needed. Shortness of breath.     amLODipine 10 MG tablet  Commonly known as:  NORVASC  Take 10 mg by mouth daily.     hydrochlorothiazide 25 MG tablet  Commonly known as:  HYDRODIURIL  Take 25 mg by mouth daily.     predniSONE 10 MG tablet  Commonly known as:  DELTASONE  Take 4 tablets by mouth for 3 days, then take 3 tablets for 3 days, then take 2 tablets for 3 days, then take 1 tablet for 3 days then stop     sertraline 50 MG tablet  Commonly known as:  ZOLOFT  Take 50 mg by mouth daily.        Disposition and follow-up:   Mr.Troy Phillips was discharged from [facility] in Stable condition.  At the hospital follow up visit please address:  1.  Asthma- Patient with asthma exacerbation without previous history of being treated with oral or inhaled steroids. Patient's asthma medication regimen could be reevaluated as he might benefit from oral steroids, though it is hard to know where patient's new baseline will be until weeks after this acute exacerbation. Patient was discharged with oral prednisone taper.  2.  Labs / imaging needed at time of follow-up: none  3.  Pending labs/ test needing follow-up: none  Follow-up Appointments: Follow-up Information   Follow up with [facility], MD. Schedule an appointment as soon as possible for a visit in 1 week.   Contact information:   [address] [place] [address]      Discharge Instructions: Discharge Orders   Future Orders Complete By Expires     Call MD for:  severe uncontrolled pain  As directed     Call MD for:  temperature >100.4  As directed     Call MD for:  As directed     Scheduling Instructions:      Increasing wheezing or shortness of breath    Diet - low sodium heart healthy  As directed     Increase activity slowly  As directed       Consultations:  none  Procedures Performed:  Dg Chest 2 View  10/17/2012   *RADIOLOGY REPORT*  Clinical Data: Chest pain  CHEST - 2 VIEW  Comparison: Prior radiograph from 02/13/2009  Findings: The cardiac and mediastinal silhouettes are stable in size and contour, and remain within normal limits.  The lungs are normally inflated.  There is no airspace consolidation, pleural effusion, or pulmonary edema. No pneumothorax.  The osseous structures and soft tissues are unremarkable.  IMPRESSION: No acute cardiopulmonary process.   Original Report Authenticated By: Rise Mu, M.D.   Admission HPI:   This is a 27yo AA man with h/o HTN and asthma who presents with SOB and cough x 4 days. Patient reports having worsening exertional SOB and a cough productive of yellow-green sputum with blood streaks. When patient arrived in ED he was tachycardic and hypoxic at rest (89%) with peak  flow of 180 (predicted PEFR 644). After receiving 4 albuterol and 3 ipratropium nebulizers in ED, his PEFR improved to 340 and SpO2 was 94% at rest, but 87% with ambulation. Patient reports that he had some chest tightness yesterday when he arrived in the ED, but this resolved with the nebulizer treatments in the ED. Patient also endorses having sore throat and some cold sweats yesterday prior to arrival in ED. He lost his albuterol inhaler 2 weeks ago, but reports that at baseline he usually uses his inhaler twice weekly. He has no h/o previous intubation or other admission for asthma. He has never used oral or inhaled steroids, has only been managed on an albuterol inhaler in the  past. Denies N/V/D, abd pain, fever. He has not been sick recently and has had no sick contacts. 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 Of note, patient also received a CXR in ED, which showed no acute process as well as a VBG, which was wnl.   Hospital Course by problem list:   1. Acute asthma exacerbation-  Patient presented with DOE and wheezing with associated chest tightness and was hypoxic to 89% on room air with a PEFR <50% expected upon arrival to ED, CXR in ED was normal. After multiple albuterol/ipratropium nebs and one dose of prednisone 60mg  po in the ED, patient was no longer hypoxic at rest, but was hypoxic to 87% upon ambulation. Patient was admitted for observation and continued albuterol nebulizer treatments every four hours over night. Patient's SOB resolved overnight, yet he still had expiratory wheezing on exam on the morning of discharge. Patient also endorsed having recent cough with associated sore throat that began on day prior to admission, which is thought to be due to post nasal drip. Patient did not require oxygen supplementation overnight and was not hypoxic with ambulation today and thus, was discharged. He received total of 3 doses of prednisone 60mg  po during this admission and was discharged with a 12 day taper of prednisone (40mg  x 3 days, 30mg  x 3 days, 20mg  x 3 days, 10mg  x 3 days). Patient lost his albuterol inhaler (which he uses twice weekly at baseline prior to four days ago) 2 weeks ago, so I discharged him with a prescription for a new albuterol inhaler. I think patient may benefit from inhaled steroids, but this should be determined in the outpatient setting.  2. HTN- Patient was normotensive (117/62) during most of this admission, so his antihypertensives were held. HCTZ and amlodipine were restarted upon discharge since the last two BP measurements were 150s/80s.  Discharge Vitals:   BP 125/64  Pulse  59  Temp(Src) 97.6 F (36.4 C)  Resp 18  SpO2 97%  Discharge Labs:  Results for orders placed during the hospital encounter of 10/17/12 (from the past 24 hour(s))  BASIC METABOLIC PANEL     Status: Abnormal   Collection Time    10/18/12  6:30 AM      Result Value Range   Sodium 140  135 - 145 mEq/L   Potassium 4.1  3.5 - 5.1 mEq/L   Chloride 103  96 - 112 mEq/L   CO2 28  19 - 32 mEq/L   Glucose, Bld 125 (*) 70 - 99 mg/dL   BUN 13  6 - 23 mg/dL   Creatinine, Ser 1.61  0.50 - 1.35 mg/dL   Calcium 9.7  8.4 - 09.6 mg/dL  GFR calc non Af Amer >90  >90 mL/min   GFR calc Af Amer >90  >90 mL/min    Signed: Windell Hummingbird, MD 10/18/2012, 1:35 PM   Time Spent on Discharge: 35 minutes Services Ordered on Discharge: none Equipment Ordered on Discharge: none

## 2012-10-18 NOTE — Progress Notes (Signed)
Before breathing treatment patients peak flow meter was 350, after treatment peak flow was 400.

## 2012-10-18 NOTE — Progress Notes (Signed)
Patient ambulated around unit with pulse ox. Patients O2 Sat ranged from 94-97% on room air.

## 2012-10-18 NOTE — Progress Notes (Signed)
Utilization review completed. Stelios Kirby RN CCM Case Mgmt phone [phone] 

## 2012-10-18 NOTE — H&P (Signed)
I saw and evaluated the patient. I reviewed the resident's note and confirmed the resident's findings.  I agree with the assessment and plan as documented in the resident's note.  Briefly, Troy Phillips is a 27 yo man with a history of mild asthma treated with 2X/week albuterol prn (never required oral or inhaled steroids) who presents with a 4 day history of progressive DOE, SOB, cough, and yellow-green sputum.  He was felt to have an asthma exacerbation secondary to an exacerbation of rhinitis with post-nasal drip.  He was treated with bronchodilators and a prednisone burst.  He feels much improved today.  Examination reveals good air movement with diffuse end-expiratory wheezes and a slightly prolonged expiratory phase.  He is stable for discharge home on a prednisone taper and as needed albuterol with PCP follow-up.

## 2012-10-18 NOTE — Progress Notes (Signed)
Patient discharged to home accompanied by family. Discharge instructions and rx given and explained and patient stated understanding. Patients IV was removed prior to discharge. Patient left unit in a stable condition.

## 2012-11-12 ENCOUNTER — Encounter (HOSPITAL_COMMUNITY): Payer: Self-pay | Admitting: Emergency Medicine

## 2012-11-12 ENCOUNTER — Emergency Department (HOSPITAL_COMMUNITY)
Admission: EM | Admit: 2012-11-12 | Discharge: 2012-11-12 | Disposition: A | Discharge status: Home / Self Care | Payer: Medicaid Other | Attending: Emergency Medicine | Admitting: Emergency Medicine

## 2012-11-12 DIAGNOSIS — F329 Major depressive disorder, single episode, unspecified: Secondary | ICD-10-CM | POA: Insufficient documentation

## 2012-11-12 DIAGNOSIS — J029 Acute pharyngitis, unspecified: Secondary | ICD-10-CM | POA: Insufficient documentation

## 2012-11-12 DIAGNOSIS — Z79899 Other long term (current) drug therapy: Secondary | ICD-10-CM | POA: Insufficient documentation

## 2012-11-12 DIAGNOSIS — Z8673 Personal history of transient ischemic attack (TIA), and cerebral infarction without residual deficits: Secondary | ICD-10-CM | POA: Insufficient documentation

## 2012-11-12 DIAGNOSIS — R05 Cough: Secondary | ICD-10-CM | POA: Insufficient documentation

## 2012-11-12 DIAGNOSIS — I10 Essential (primary) hypertension: Secondary | ICD-10-CM | POA: Insufficient documentation

## 2012-11-12 DIAGNOSIS — J45909 Unspecified asthma, uncomplicated: Secondary | ICD-10-CM | POA: Insufficient documentation

## 2012-11-12 DIAGNOSIS — R059 Cough, unspecified: Secondary | ICD-10-CM | POA: Insufficient documentation

## 2012-11-12 DIAGNOSIS — R599 Enlarged lymph nodes, unspecified: Secondary | ICD-10-CM | POA: Insufficient documentation

## 2012-11-12 DIAGNOSIS — Z8669 Personal history of other diseases of the nervous system and sense organs: Secondary | ICD-10-CM | POA: Insufficient documentation

## 2012-11-12 DIAGNOSIS — F3289 Other specified depressive episodes: Secondary | ICD-10-CM | POA: Insufficient documentation

## 2012-11-12 DIAGNOSIS — R1310 Dysphagia, unspecified: Secondary | ICD-10-CM | POA: Insufficient documentation

## 2012-11-12 HISTORY — DX: Depression, unspecified: F32.A

## 2012-11-12 HISTORY — DX: Cerebral palsy, unspecified: G80.9

## 2012-11-12 HISTORY — DX: Cerebral infarction, unspecified: I63.9

## 2012-11-12 HISTORY — DX: Major depressive disorder, single episode, unspecified: F32.9

## 2012-11-12 LAB — RAPID STREP SCREEN (MED CTR MEBANE ONLY): Streptococcus, Group A Screen (Direct): NEGATIVE

## 2012-11-12 MED ORDER — HYDROCODONE-HOMATROPINE 5-1.5 MG/5ML PO SYRP: 5.0000 mL | ORAL_SOLUTION | Freq: Four times a day (QID) | ORAL | Status: DC | PRN

## 2012-11-12 NOTE — ED Notes (Signed)
Sore throat since yesterday and today feels swollen  Hard to talk  Had sinus drainage about 2 weeks ago

## 2012-11-12 NOTE — ED Provider Notes (Signed)
CSN: [id number]     Arrival date & time 11/12/12  1042 History     First MD Initiated Contact with Patient 11/12/12 1052     Chief Complaint  Patient presents with  . Sore Throat   (Consider location/radiation/quality/duration/timing/severity/associated sxs/prior Treatment) HPI Comments: Patient is a 27 presented to the emergency department for one day worsening sore throat with associated cervical lymphadenopathy and dry cough. Patient states that 2 weeks ago he had nasal congestion. He rates his current pain 7/10 but has not tried any OTC medications for relief. Patient states eating and drinking and talking worsened his pain. He denies any fevers chills, trismus, drooling.   Patient is a 27 y.o. male presenting with pharyngitis.  Sore Throat Associated symptoms include a sore throat. Pertinent negatives include no congestion, fever, headaches or neck pain.    Past Medical History  Diagnosis Date  . Hypertension   . Asthma   . Depression   . 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 Cerebral palsy    History reviewed. No pertinent past surgical history. History reviewed. No pertinent family history. History  Substance Use Topics  . Smoking status: Never Smoker   . Smokeless tobacco: Never Used  . Alcohol Use: Yes    Review of Systems  Constitutional: Negative for fever.  HENT: Positive for sore throat. Negative for ear pain, congestion, facial swelling, rhinorrhea, drooling, trouble swallowing, neck pain, voice change and ear discharge.   Neurological: Negative for headaches.    Allergies  Review of patient's allergies indicates no known allergies.  Home Medications   Current Outpatient Rx  Name  Route  Sig  Dispense  Refill  . albuterol (PROVENTIL HFA;VENTOLIN HFA) 108 (90 BASE) MCG/ACT inhaler   Inhalation   Inhale 2 puffs into the lungs every 4 (four) hours as needed. Shortness of breath.         Marland Kitchen amLODipine (NORVASC) 10 MG tablet   Oral   Take 10 mg by mouth daily.          . hydrochlorothiazide (HYDRODIURIL) 25 MG tablet   Oral   Take 25 mg by mouth daily.          . sertraline (ZOLOFT) 50 MG tablet   Oral   Take 50 mg by mouth daily.         Marland Kitchen HYDROcodone-homatropine (HYCODAN) 5-1.5 MG/5ML syrup   Oral   Take 5 mL by mouth every 6 (six) hours as needed for cough.   120 mL   0    BP 154/91  Pulse 89  Temp(Src) 97.1 F (36.2 C) (Oral)  SpO2 98% Physical Exam  Constitutional: He is oriented to person, place, and time. He appears well-developed and well-nourished. No distress.  HENT:  Head: Normocephalic and atraumatic.  Right Ear: External ear normal.  Left Ear: External ear normal.  Nose: Nose normal.  Mouth/Throat: Uvula is midline and mucous membranes are normal. Posterior oropharyngeal erythema present.  Eyes: Conjunctivae are normal.  Neck: Normal range of motion. Neck supple.  Lymphadenopathy:    He has cervical adenopathy.  Neurological: He is alert and oriented to person, place, and time.  Skin: Skin is warm and dry. He is not diaphoretic.  Psychiatric: He has a normal mood and affect.    ED Course   Procedures (including critical care time)  Labs Reviewed  RAPID STREP SCREEN  CULTURE, GROUP A STREP   No results found. 1. Viral pharyngitis     MDM  Pt afebrile without tonsillar exudate, negative strep. Presents with mild cervical lymphadenopathy, & dysphagia; diagnosis of viral pharyngitis. No abx indicated. DC w symptomatic tx for pain  Pt does not appear dehydrated, but did discuss importance of water rehydration. Presentation non concerning for PTA or infxn spread to soft tissue. No trismus or uvula deviation. Specific return precautions discussed. Pt able to drink water in ED without difficulty with intact air way. Recommended PCP follow up.   Jeannetta Ellis, PA-C 11/12/12 1517

## 2012-11-12 NOTE — ED Provider Notes (Signed)
Medical screening examination/treatment/procedure(s) were performed by non-physician practitioner and as supervising physician I was immediately available for consultation/collaboration.  Raynette Arras, MD 11/12/12 1559 

## 2012-11-14 LAB — CULTURE, GROUP A STREP

## 2013-03-08 ENCOUNTER — Encounter (HOSPITAL_COMMUNITY): Payer: Self-pay | Admitting: Emergency Medicine

## 2013-03-08 ENCOUNTER — Emergency Department (HOSPITAL_COMMUNITY)
Admission: EM | Admit: 2013-03-08 | Discharge: 2013-03-08 | Disposition: A | Discharge status: Home / Self Care | Payer: Medicaid Other | Attending: Emergency Medicine | Admitting: Emergency Medicine

## 2013-03-08 ENCOUNTER — Emergency Department (HOSPITAL_COMMUNITY): Payer: Medicaid Other

## 2013-03-08 DIAGNOSIS — J45909 Unspecified asthma, uncomplicated: Secondary | ICD-10-CM | POA: Insufficient documentation

## 2013-03-08 DIAGNOSIS — Y929 Unspecified place or not applicable: Secondary | ICD-10-CM | POA: Insufficient documentation

## 2013-03-08 DIAGNOSIS — F3289 Other specified depressive episodes: Secondary | ICD-10-CM | POA: Insufficient documentation

## 2013-03-08 DIAGNOSIS — Z79899 Other long term (current) drug therapy: Secondary | ICD-10-CM | POA: Insufficient documentation

## 2013-03-08 DIAGNOSIS — Z8673 Personal history of transient ischemic attack (TIA), and cerebral infarction without residual deficits: Secondary | ICD-10-CM | POA: Insufficient documentation

## 2013-03-08 DIAGNOSIS — Y9301 Activity, walking, marching and hiking: Secondary | ICD-10-CM | POA: Insufficient documentation

## 2013-03-08 DIAGNOSIS — F329 Major depressive disorder, single episode, unspecified: Secondary | ICD-10-CM | POA: Insufficient documentation

## 2013-03-08 DIAGNOSIS — S93409A Sprain of unspecified ligament of unspecified ankle, initial encounter: Secondary | ICD-10-CM | POA: Insufficient documentation

## 2013-03-08 DIAGNOSIS — W010XXA Fall on same level from slipping, tripping and stumbling without subsequent striking against object, initial encounter: Secondary | ICD-10-CM | POA: Insufficient documentation

## 2013-03-08 DIAGNOSIS — Z8669 Personal history of other diseases of the nervous system and sense organs: Secondary | ICD-10-CM | POA: Insufficient documentation

## 2013-03-08 DIAGNOSIS — I10 Essential (primary) hypertension: Secondary | ICD-10-CM | POA: Insufficient documentation

## 2013-03-08 DIAGNOSIS — X500XXA Overexertion from strenuous movement or load, initial encounter: Secondary | ICD-10-CM | POA: Insufficient documentation

## 2013-03-08 DIAGNOSIS — S93402A Sprain of unspecified ligament of left ankle, initial encounter: Secondary | ICD-10-CM

## 2013-03-08 MED ORDER — IBUPROFEN 800 MG PO TABS: 800.0000 mg | ORAL_TABLET | Freq: Three times a day (TID) | ORAL | Status: DC

## 2013-03-08 MED ORDER — OXYCODONE-ACETAMINOPHEN 5-325 MG PO TABS
2.0000 | ORAL_TABLET | Freq: Once | ORAL | Status: AC
  Administered 2013-03-08: 2 via ORAL
  Filled 2013-03-08: qty 2

## 2013-03-08 MED ORDER — CYCLOBENZAPRINE HCL 10 MG PO TABS: 10.0000 mg | ORAL_TABLET | Freq: Two times a day (BID) | ORAL | Status: DC | PRN

## 2013-03-08 NOTE — ED Provider Notes (Signed)
CSN: [id number]     Arrival date & time 03/08/13  0810 History   First MD Initiated Contact with Patient 03/08/13 0820     Chief Complaint  Patient presents with  . Ankle Pain   (Consider location/radiation/quality/duration/timing/severity/associated sxs/prior Treatment) HPI  27 year old male with history of cerebral palsy affecting his left side presents for evaluation of left ankle injury. Patient reports he was walking down a wet step last night, slipped on a wet spot, and twisted his left ankle. He did fall but denies hitting his head or loss of consciousness. He reports acute onset of sharp throbbing pain to his ankle, pain is nonradiating however he was unable to bear any weight. He tried to sleep it off however this morning pain persisted and unable to bear any weight on it. No specific treatment tried. Denies any knee or hip pain. Denies any numbness. No precipitating factors prior to the fall. Denies new numbness, weakness, or   Past Medical History  Diagnosis Date  . Hypertension   . Asthma   . Depression   . 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 Cerebral palsy    History reviewed. No pertinent past surgical history. History reviewed. No pertinent family history. History  Substance Use Topics  . Smoking status: Never Smoker   . Smokeless tobacco: Never Used  . Alcohol Use: Yes    Review of Systems  Constitutional: Negative for fever.  Musculoskeletal: Positive for arthralgias, joint swelling and myalgias.  Neurological: Negative for numbness.    Allergies  Review of patient's allergies indicates no known allergies.  Home Medications   Current Outpatient Rx  Name  Route  Sig  Dispense  Refill  . albuterol (PROVENTIL HFA;VENTOLIN HFA) 108 (90 BASE) MCG/ACT inhaler   Inhalation   Inhale 2 puffs into the lungs every 4 (four) hours as needed. Shortness of breath.         Marland Kitchen amLODipine (NORVASC) 10 MG tablet   Oral   Take 10 mg by mouth daily.          . hydrochlorothiazide  (HYDRODIURIL) 25 MG tablet   Oral   Take 25 mg by mouth daily.          Marland Kitchen HYDROcodone-homatropine (HYCODAN) 5-1.5 MG/5ML syrup   Oral   Take 5 mL by mouth every 6 (six) hours as needed for cough.   120 mL   0   . sertraline (ZOLOFT) 50 MG tablet   Oral   Take 50 mg by mouth daily.          BP 130/68  Pulse 85  Temp(Src) 98.1 F (36.7 C) (Oral)  Resp 18  SpO2 96% Physical Exam  Constitutional: He appears well-developed and well-nourished. No distress.  HENT:  Head: Atraumatic.  Eyes: Conjunctivae are normal.  Neck: Normal range of motion. Neck supple.  Cardiovascular: Intact distal pulses.   Musculoskeletal: He exhibits tenderness (Left ankle: Moderate tenderness to medial malleolus, lateral malleolus, and posterior ankle. Decreased dorsiflexion and plantar flexion secondary to pain. Edema noted to medial malleolus. Intact distal pedal pulses with brisk cap refill. No foot tenderness). He exhibits no edema.  Neurological: He is alert.  Skin: No rash noted.  Psychiatric: He has a normal mood and affect.    ED Course  Procedures (including critical care time)  8:48 AM Mechanical injury to L ankle from fall.  Suspect ankle sprain but will obtain xray to r/o fx.  Pain medication given.  Pt is NVI.  11:26 AM X-ray without acute fractures or dislocation. ASO ankle sprain. SL apply and crutches given for support. Rice therapy discussed. Orthopedic referral given as needed. Return percussion discussed.  Labs Review Labs Reviewed - No data to display Imaging Review Dg Ankle Complete Left  03/08/2013   CLINICAL DATA:  Pain post trauma  EXAM: LEFT ANKLE COMPLETE - 3+ VIEW  COMPARISON:  None.  FINDINGS: Frontal, oblique, and lateral views were obtained. There is no fracture or effusion. Ankle mortise appears intact. There is a small benign appearing exostosis arising from the dorsal distal talus.  IMPRESSION: No fracture. Mortise intact. Benign appearing small exostosis  arising from distal talus.   Electronically Signed   By: Bretta Bang M.D.   On: 03/08/2013 11:13    EKG Interpretation   None       MDM   1. Left ankle sprain, initial encounter    BP 130/88  Pulse 67  Temp(Src) 97.1 F (36.2 C) (Oral)  Resp 18  SpO2 99%  I have reviewed nursing notes and vital signs. I personally reviewed the imaging tests through PACS system  I reviewed available ER/hospitalization records thought the EMR     Fayrene Helper, [place] 03/08/13 1135

## 2013-03-08 NOTE — ED Notes (Signed)
Called xray to check on delay in results. They advise the canopy is very slow this morning. Advise it should not take much longer. Spoke to ryan

## 2013-03-08 NOTE — ED Notes (Signed)
Pt sts slipped and fell last night c/o left ankle pain; CMS intact

## 2013-03-09 NOTE — ED Provider Notes (Signed)
Medical screening examination/treatment/procedure(s) were performed by non-physician practitioner and as supervising physician I was immediately available for consultation/collaboration.  Toy Baker, MD 03/09/13 [phone]

## 2014-05-07 ENCOUNTER — Emergency Department (INDEPENDENT_AMBULATORY_CARE_PROVIDER_SITE_OTHER): Payer: Medicaid Other

## 2014-05-07 ENCOUNTER — Emergency Department (INDEPENDENT_AMBULATORY_CARE_PROVIDER_SITE_OTHER)
Admission: EM | Admit: 2014-05-07 | Discharge: 2014-05-07 | Disposition: A | Discharge status: Home / Self Care | Payer: Medicaid Other | Source: Home / Self Care | Attending: Emergency Medicine | Admitting: Emergency Medicine

## 2014-05-07 ENCOUNTER — Encounter (HOSPITAL_COMMUNITY): Payer: Self-pay | Admitting: *Deleted

## 2014-05-07 DIAGNOSIS — S62309A Unspecified fracture of unspecified metacarpal bone, initial encounter for closed fracture: Secondary | ICD-10-CM

## 2014-05-07 DIAGNOSIS — S62339A Displaced fracture of neck of unspecified metacarpal bone, initial encounter for closed fracture: Secondary | ICD-10-CM

## 2014-05-07 MED ORDER — HYDROCODONE-ACETAMINOPHEN 5-325 MG PO TABS: 1.0000 | ORAL_TABLET | Freq: Four times a day (QID) | ORAL | Status: DC | PRN

## 2014-05-07 MED ORDER — HYDROCODONE-ACETAMINOPHEN 5-325 MG PO TABS
2.0000 | ORAL_TABLET | Freq: Once | ORAL | Status: AC
  Administered 2014-05-07: 2 via ORAL

## 2014-05-07 MED ORDER — HYDROCODONE-ACETAMINOPHEN 5-325 MG PO TABS
ORAL_TABLET | ORAL | Status: AC
  Filled 2014-05-07: qty 2

## 2014-05-07 NOTE — ED Notes (Signed)
Ortho tech coming to [facility]
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
## 2014-05-07 NOTE — Discharge Instructions (Signed)
You have a broken bone in your hand. Take ibuprofen 3 times a day to help with pain. Use the Norco every 6 hours as needed for severe pain. Leave the splint on until you follow up with Dr. Janee Mornhompson of orthopedic surgery. Call Dr. Carollee Massedhompson's office tomorrow morning for an appointment.

## 2014-05-07 NOTE — ED Notes (Signed)
Ulnar  Gutter splint  Applied  By   Ortho  tech

## 2014-05-07 NOTE — Progress Notes (Signed)
Orthopedic Tech Progress Note Patient Details:  Troy Phillips 08/28/1985 [id number]  Ortho Devices Type of Ortho Device: Ace wrap, Ulna gutter splint Ortho Device/Splint Location: rue Ortho Device/Splint Interventions: Application   [name], Lativia Velie 05/07/2014, 5:49 PM

## 2015-07-09 ENCOUNTER — Ambulatory Visit (HOSPITAL_COMMUNITY)
Admission: EM | Admit: 2015-07-09 | Discharge: 2015-07-09 | Disposition: A | Discharge status: Home / Self Care | Payer: Medicaid Other | Attending: Emergency Medicine | Admitting: Emergency Medicine

## 2015-07-09 ENCOUNTER — Encounter (HOSPITAL_COMMUNITY): Payer: Self-pay | Admitting: Emergency Medicine

## 2015-07-09 DIAGNOSIS — K602 Anal fissure, unspecified: Secondary | ICD-10-CM | POA: Diagnosis not present

## 2015-07-09 DIAGNOSIS — M545 Low back pain, unspecified: Secondary | ICD-10-CM

## 2015-07-09 LAB — OCCULT BLOOD, POC DEVICE: Fecal Occult Bld: POSITIVE — AB

## 2015-07-09 MED ORDER — HYDROCODONE-ACETAMINOPHEN 5-325 MG PO TABS: 1.0000 | ORAL_TABLET | Freq: Four times a day (QID) | ORAL | Status: DC | PRN

## 2015-07-09 MED ORDER — CYCLOBENZAPRINE HCL 10 MG PO TABS: 10.0000 mg | ORAL_TABLET | Freq: Three times a day (TID) | ORAL | Status: DC | PRN

## 2015-07-09 MED ORDER — MELOXICAM 15 MG PO TABS: 15.0000 mg | ORAL_TABLET | Freq: Every day | ORAL | Status: DC

## 2015-07-09 NOTE — Discharge Instructions (Signed)
Your back pain is likely from carrying your girlfriend yesterday. Take meloxicam daily for the next week, then as needed for pain. Flexeril 3 times a day as needed for muscle spasms. This medicine may make you drowsy. Use the hydrocodone every 6 hours as needed for severe pain. Do not drive while taking this medicine.  I do not see any sign of hemorrhoids on exam. I suspect you have a small tear in the mucosa that caused the bleeding. This will heal. It is important to prevent constipation for the next week. Make sure you are drinking plenty of fluids and getting fiber in your diet. If you do become constipated, use over-the-counter MiraLAX. You may see some blood in your stool for the next week, but it should gradually improve.  Follow-up with your PCP as needed.

## 2015-07-09 NOTE — ED Provider Notes (Signed)
CSN: [id number]     Arrival date & time 07/09/15  1837 History   First MD Initiated Contact with Patient 07/09/15 1930     Chief Complaint  Patient presents with  . Hematochezia   (Consider location/radiation/quality/duration/timing/severity/associated sxs/prior Treatment) HPI  He is a 30 year old man here for 2 complaints.  First complaint is of blood in his stool today. He reports seeing her current blood in stool with his bowel movement this morning. He denies any pain with the bowel movement. No history of constipation. He has not seen any additional blood.  He also reports low back pain that started last night. He states he had to carry his girlfriend due to a leg injury. Since that time he has had pain that goes from lower back up to the mid back. No radiation into the legs. He describes it as a sharp pain. Nothing makes it better or worse. It is unrelated to the blood in his stool.  Past Medical History  Diagnosis Date  . Hypertension   . Asthma   . Depression   . 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 Cerebral palsy (HCC)    History reviewed. No pertinent past surgical history. History reviewed. No pertinent family history. Social History  Substance Use Topics  . Smoking status: Never Smoker   . Smokeless tobacco: Never Used  . Alcohol Use: Yes    Review of Systems As in history of present illness Allergies  Review of patient's allergies indicates no known allergies.  Home Medications   Prior to Admission medications   Medication Sig Start Date End Date Taking? Authorizing Provider  albuterol (PROVENTIL HFA;VENTOLIN HFA) 108 (90 BASE) MCG/ACT inhaler Inhale 2 puffs into the lungs every 4 (four) hours as needed. Shortness of breath. 10/18/12   Coolidge Breezeachel E Chikowski, MD  amLODipine (NORVASC) 10 MG tablet Take 10 mg by mouth daily.     Historical Provider, MD  cyclobenzaprine (FLEXERIL) 10 MG tablet Take 1 tablet (10 mg total) by mouth 3 (three) times daily as needed for muscle  spasms. 07/09/15   Charm RingsErin J Kehaulani Fruin, MD  hydrochlorothiazide (HYDRODIURIL) 25 MG tablet Take 25 mg by mouth daily.     Historical Provider, MD  HYDROcodone-acetaminophen (NORCO) 5-325 MG tablet Take 1 tablet by mouth every 6 (six) hours as needed for moderate pain. 07/09/15   Charm RingsErin J Emmagene Ortner, MD  meloxicam (MOBIC) 15 MG tablet Take 1 tablet (15 mg total) by mouth daily. 07/09/15   Charm RingsErin J Eileene Kisling, MD  sertraline (ZOLOFT) 50 MG tablet Take 50 mg by mouth daily.    Historical Provider, MD   Meds Ordered and Administered this Visit  Medications - No data to display  BP 144/92 mmHg  Pulse 73  Temp(Src) 98.5 F (36.9 C) (Oral)  Resp 20  SpO2 99% No data found.   Physical Exam  Constitutional: He is oriented to person, place, and time. He appears well-developed and well-nourished. No distress.  Cardiovascular: Normal rate.   Pulmonary/Chest: Effort normal.  Genitourinary: Guaiac positive stool.  No internal or external hemorrhoids on rectal exam. Normal rectal tone. He is quite tender with the rectal exam.  Musculoskeletal:  Back: No erythema or edema. No appreciable muscle spasm. He is diffusely tender across the lower back.  Neurological: He is alert and oriented to person, place, and time.    ED Course  Procedures (including critical care time)  Labs Review Labs Reviewed  OCCULT BLOOD, POC DEVICE    Imaging Review  No results found.   MDM   1. Anal fissure   2. Midline low back pain without sciatica    Suspect anal fissure given tenderness on rectal exam. Discussed symptomatic treatment. Meloxicam, Flexeril, and hydrocodone for back pain. Follow-up if not improving over the next 1-2 weeks.    Charm Rings, MD 07/09/15 2009

## 2015-07-09 NOTE — ED Notes (Signed)
[facility] a [facility] blood in his stool today. The patient described the blood as dark red.

## 2015-07-20 ENCOUNTER — Emergency Department (HOSPITAL_COMMUNITY): Payer: Medicaid Other

## 2015-07-20 ENCOUNTER — Encounter (HOSPITAL_COMMUNITY): Payer: Self-pay | Admitting: Emergency Medicine

## 2015-07-20 DIAGNOSIS — I10 Essential (primary) hypertension: Secondary | ICD-10-CM | POA: Diagnosis not present

## 2015-07-20 DIAGNOSIS — J45901 Unspecified asthma with (acute) exacerbation: Secondary | ICD-10-CM | POA: Diagnosis not present

## 2015-07-20 DIAGNOSIS — R079 Chest pain, unspecified: Secondary | ICD-10-CM | POA: Diagnosis present

## 2015-07-20 DIAGNOSIS — Z8669 Personal history of other diseases of the nervous system and sense organs: Secondary | ICD-10-CM | POA: Diagnosis not present

## 2015-07-20 DIAGNOSIS — Z8659 Personal history of other mental and behavioral disorders: Secondary | ICD-10-CM | POA: Insufficient documentation

## 2015-07-20 DIAGNOSIS — Z8673 Personal history of transient ischemic attack (TIA), and cerebral infarction without residual deficits: Secondary | ICD-10-CM | POA: Diagnosis not present

## 2015-07-20 LAB — BASIC METABOLIC PANEL
ANION GAP: 11 (ref 5–15)
BUN: 10 mg/dL (ref 6–20)
CHLORIDE: 106 mmol/L (ref 101–111)
CO2: 25 mmol/L (ref 22–32)
Calcium: 9.2 mg/dL (ref 8.9–10.3)
Creatinine, Ser: 1.07 mg/dL (ref 0.61–1.24)
GFR calc non Af Amer: >60 mL/min (ref >60)
Glucose, Bld: 133 mg/dL — ABNORMAL HIGH (ref 65–99)
POTASSIUM: 3.9 mmol/L (ref 3.5–5.1)
SODIUM: 142 mmol/L (ref 135–145)

## 2015-07-20 LAB — CBC
HEMATOCRIT: 44.9 % (ref 39.0–52.0)
HEMOGLOBIN: 14.6 g/dL (ref 13.0–17.0)
MCH: 27.4 pg (ref 26.0–34.0)
MCHC: 32.5 g/dL (ref 30.0–36.0)
MCV: 84.2 fL (ref 78.0–100.0)
PLATELETS: 261 10*3/uL (ref 150–400)
RBC: 5.33 MIL/uL (ref 4.22–5.81)
RDW: 15.1 % (ref 11.5–15.5)
WBC: 10.6 10*3/uL — AB (ref 4.0–10.5)

## 2015-07-20 LAB — I-STAT TROPONIN, ED: Troponin i, poc: 0.06 ng/mL (ref 0.00–0.08)

## 2015-07-20 NOTE — ED Notes (Signed)
Pt. reports central chest pain / tightness with productive cough , SOB and chest congestion onset today , denies fever or chills, no nausea or diaphoresis .

## 2015-07-21 ENCOUNTER — Emergency Department (HOSPITAL_COMMUNITY)
Admission: EM | Admit: 2015-07-21 | Discharge: 2015-07-21 | Disposition: A | Discharge status: Home / Self Care | Payer: Medicaid Other | Attending: Emergency Medicine | Admitting: Emergency Medicine

## 2015-07-21 DIAGNOSIS — J45901 Unspecified asthma with (acute) exacerbation: Secondary | ICD-10-CM

## 2015-07-21 DIAGNOSIS — R0603 Acute respiratory distress: Secondary | ICD-10-CM

## 2015-07-21 MED ORDER — MAGNESIUM SULFATE 2 GM/50ML IV SOLN
2.0000 g | Freq: Once | INTRAVENOUS | Status: AC
  Administered 2015-07-21: 2 g via INTRAVENOUS
  Filled 2015-07-21: qty 50

## 2015-07-21 MED ORDER — SODIUM CHLORIDE 0.9 % IV BOLUS (SEPSIS)
1000.0000 mL | Freq: Once | INTRAVENOUS | Status: AC
  Administered 2015-07-21: 1000 mL via INTRAVENOUS

## 2015-07-21 MED ORDER — PREDNISONE 20 MG PO TABS: 60.0000 mg | ORAL_TABLET | Freq: Every day | ORAL | Status: DC

## 2015-07-21 MED ORDER — PREDNISONE 20 MG PO TABS
60.0000 mg | ORAL_TABLET | Freq: Once | ORAL | Status: AC
  Administered 2015-07-21: 60 mg via ORAL
  Filled 2015-07-21: qty 3

## 2015-07-21 MED ORDER — IPRATROPIUM BROMIDE 0.02 % IN SOLN
1.0000 mg | Freq: Once | RESPIRATORY_TRACT | Status: AC
  Administered 2015-07-21: 1 mg via RESPIRATORY_TRACT
  Filled 2015-07-21: qty 5

## 2015-07-21 MED ORDER — ALBUTEROL (5 MG/ML) CONTINUOUS INHALATION SOLN
10.0000 mg/h | INHALATION_SOLUTION | RESPIRATORY_TRACT | Status: DC
  Administered 2015-07-21: 10 mg/h via RESPIRATORY_TRACT
  Filled 2015-07-21: qty 20

## 2015-07-21 NOTE — ED Provider Notes (Signed)
CSN: [id number]     Arrival date & time 07/20/15  2121 History  By signing my name below, I, Troy Phillips, attest that this documentation has been prepared under the direction and in the presence of Tomasita CrumbleAdeleke Lankford Gutzmer, MD. Electronically Signed: Phillis HaggisGabriella Phillips, ED Scribe. 07/21/2015. 2:39 AM.   Chief Complaint  Patient presents with  . Chest Pain   The history is provided by the patient. No language interpreter was used.  HPI Comments: Troy Phillips is a 30 y.o. Male with a hx of HTN, asthma, stroke, and cerebral palsy who presents to the Emergency Department complaining of central chest pain and tightness onset 3 days ago. He reports associated productive cough with thick sputum. He reports worsening chest pain with coughing, SOB, diaphoresis, and "hot flashes." He reports one sick contact. He denies fever, chills, nausea, or vomiting. He did not receive a flu vaccination this year.   Past Medical History  Diagnosis Date  . Hypertension   . Asthma   . Depression   . 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 Cerebral palsy (HCC)    History reviewed. No pertinent past surgical history. No family history on file. Social History  Substance Use Topics  . Smoking status: Never Smoker   . Smokeless tobacco: Never Used  . Alcohol Use: Yes    Review of Systems 10 Systems reviewed and all are negative for acute change except as noted in the HPI.  Allergies  Review of patient's allergies indicates no known allergies.  Home Medications   Prior to Admission medications   Medication Sig Start Date End Date Taking? Authorizing Provider  cyclobenzaprine (FLEXERIL) 10 MG tablet Take 1 tablet (10 mg total) by mouth 3 (three) times daily as needed for muscle spasms. Patient not taking: Reported on 07/21/2015 07/09/15   Charm RingsErin J Honig, MD  HYDROcodone-acetaminophen (NORCO) 5-325 MG tablet Take 1 tablet by mouth every 6 (six) hours as needed for moderate pain. Patient not taking: Reported on 07/21/2015 07/09/15    Charm RingsErin J Honig, MD  meloxicam (MOBIC) 15 MG tablet Take 1 tablet (15 mg total) by mouth daily. Patient not taking: Reported on 07/21/2015 07/09/15   Charm RingsErin J Honig, MD   BP 146/75 mmHg  Pulse 77  Temp(Src) 98.1 F (36.7 C) (Oral)  Resp 19  SpO2 94% Physical Exam  Constitutional: He is oriented to person, place, and time. Vital signs are normal. He appears well-developed and well-nourished.  Non-toxic appearance. He does not appear ill. No distress.  HENT:  Head: Normocephalic and atraumatic.  Nose: Nose normal.  Mouth/Throat: Oropharynx is clear and moist. No oropharyngeal exudate.  Eyes: Conjunctivae and EOM are normal. Pupils are equal, round, and reactive to light. No scleral icterus.  Neck: Normal range of motion. Neck supple. No tracheal deviation, no edema, no erythema and normal range of motion present. No thyroid mass and no thyromegaly present.  Cardiovascular: Normal rate, regular rhythm, S1 normal, S2 normal, normal heart sounds, intact distal pulses and normal pulses.  Exam reveals no gallop and no friction rub.   No murmur heard. Pulmonary/Chest: Effort normal. No respiratory distress. He has wheezes. He has no rhonchi. He has no rales.  Expiratory wheezing in all lung fields  Abdominal: Soft. Normal appearance and bowel sounds are normal. He exhibits no distension, no ascites and no mass. There is no hepatosplenomegaly. There is no tenderness. There is no rebound, no guarding and no CVA tenderness.  Musculoskeletal: Normal range of motion. He  exhibits no edema or tenderness.  Lymphadenopathy:    He has no cervical adenopathy.  Neurological: He is alert and oriented to person, place, and time. He has normal strength. No cranial nerve deficit or sensory deficit.  Skin: Skin is warm, dry and intact. No petechiae and no rash noted. He is not diaphoretic. No erythema. No pallor.  Psychiatric: He has a normal mood and affect. His behavior is normal. Judgment normal.  Nursing note and  vitals reviewed.   ED Course  Procedures (including critical care time) DIAGNOSTIC STUDIES: Oxygen Saturation is 94% on RA, adequate by my interpretation.    COORDINATION OF CARE: 2:34 AM-Discussed treatment plan which includes breathing treatments, steroids, labs and x-ray with pt at bedside and pt agreed to plan.    Labs Review Labs Reviewed  BASIC METABOLIC PANEL - Abnormal; Notable for the following:    Glucose, Bld 133 (*)    All other components within normal limits  CBC - Abnormal; Notable for the following:    WBC 10.6 (*)    All other components within normal limits  I-STAT TROPOININ, ED    Imaging Review Dg Chest 2 View  07/20/2015  CLINICAL DATA:  Shortness of breath and chest pain for 3 days. Productive cough. Hypertension. Smoker. EXAM: CHEST  2 VIEW COMPARISON:  10/17/2012 FINDINGS: The heart size and mediastinal contours are within normal limits. Both lungs are clear. The visualized skeletal structures are unremarkable. IMPRESSION: No active cardiopulmonary disease. Electronically Signed   By: Burman Nieves M.D.   On: 07/20/2015 22:06   I have personally reviewed and evaluated these images and lab results as part of my medical decision-making.   EKG Interpretation   Date/Time:  Friday July 20 2015 21:36:33 EDT Ventricular Rate:  89 PR Interval:  166 QRS Duration: 88 QT Interval:  384 QTC Calculation: 467 R Axis:   32 Text Interpretation:  Normal sinus rhythm Normal ECG No old tracing to  compare Confirmed by Erroll Luna [id number]) on 07/21/2015 2:06:34 AM      MDM   Final diagnoses:  None   Patient presents tot he Ed for coughing and chest pain.  PE reveals significant wheezing.  He was given albuterol, ipratropium, magnesium, prednisone for his symptoms. Also IV fluids. Chest x-rays negative for pneumonia. This is likely an exacerbation of his asthma along with bronchitis.  3:57 AM upon repeat evaluation, patient is almost always. Your  treatment. He states he feels much better and close to his baseline. Physical exam reveals minimal wheezing.  He will be safe for discharge after being treated. DC home with penicillin course and primary care follow-up. He has albuterol inhaler at home. He appears well in no acute distress, vital signs were within his normal limits and he is safe for discharge.   I personally performed the services described in this documentation, which was scribed in my presence. The recorded information has been reviewed and is accurate.     Tomasita Crumble, MD 07/21/15 [phone]

## 2015-07-21 NOTE — Discharge Instructions (Signed)
Asthma Attack Prevention Troy Phillips, use your albuterol inhaler 2 puffs every 6 hours for the next 2 days, then only as needed after that.  Take prednisone for 4 more days.  See a primary care doctor within 3 days for close follow up. If any symptoms worsen, come back to the ED immediately. Thank you. While you may not be able to control the fact that you have asthma, you can take actions to prevent asthma attacks. The best way to prevent asthma attacks is to maintain good control of your asthma. You can achieve this by:  Taking your medicines as directed.  Avoiding things that can irritate your airways or make your asthma symptoms worse (asthma triggers).  Keeping track of how well your asthma is controlled and of any changes in your symptoms.  Responding quickly to worsening asthma symptoms (asthma attack).  Seeking emergency care when it is needed. WHAT ARE SOME WAYS TO PREVENT AN ASTHMA ATTACK? Have a Plan Work with your health care provider to create a written plan for managing and treating your asthma attacks (asthma action plan). This plan includes:  A list of your asthma triggers and how you can avoid them.  Information on when medicines should be taken and when their dosages should be changed.  The use of a device that measures how well your lungs are working (peak flow meter). Monitor Your Asthma Use your peak flow meter and record your results in a journal every day. A drop in your peak flow numbers on one or more days may indicate the start of an asthma attack. This can happen even before you start to feel symptoms. You can prevent an asthma attack from getting worse by following the steps in your asthma action plan. Avoid Asthma Triggers Work with your asthma health care provider to find out what your asthma triggers are. This can be done by:  Allergy testing.  Keeping a journal that notes when asthma attacks occur and the factors that may have contributed to  them.  Determining if there are other medical conditions that are making your asthma worse. Once you have determined your asthma triggers, take steps to avoid them. This may include avoiding excessive or prolonged exposure to:  Dust. Have someone dust and vacuum your home for you once or twice a week. Using a high-efficiency particulate arrestance (HEPA) vacuum is best.  Smoke. This includes campfire smoke, forest fire smoke, and secondhand smoke from tobacco products.  Pet dander. Avoid contact with animals that you know you are allergic to.  Allergens from trees, grasses or pollens. Avoid spending a lot of time outdoors when pollen counts are high, and on very windy days.  Very cold, dry, or humid air.  Mold.  Foods that contain high amounts of sulfites.  Strong odors.  Outdoor air pollutants, such as Museum/gallery exhibitions officer.  Indoor air pollutants, such as aerosol sprays and fumes from household cleaners.  Household pests, including dust mites and cockroaches, and pest droppings.  Certain medicines, including NSAIDs. Always talk to your health care provider before stopping or starting any new medicines. Medicines Take over-the-counter and prescription medicines only as told by your health care provider. Many asthma attacks can be prevented by carefully following your medicine schedule. Taking your medicines correctly is especially important when you cannot avoid certain asthma triggers. Act Quickly If an asthma attack does happen, acting quickly can decrease how severe it is and how long it lasts. Take these steps:   Pay attention to  your symptoms. If you are coughing, wheezing, or having difficulty breathing, do not wait to see if your symptoms go away on their own. Follow your asthma action plan.  If you have followed your asthma action plan and your symptoms are not improving, call your health care provider or seek immediate medical care at the nearest hospital. It is important to  note how often you need to use your fast-acting rescue inhaler. If you are using your rescue inhaler more often, it may mean that your asthma is not under control. Adjusting your asthma treatment plan may help you to prevent future asthma attacks and help you to gain better control of your condition. HOW CAN I PREVENT AN ASTHMA ATTACK WHEN I EXERCISE? Follow advice from your health care provider about whether you should use your fast-acting inhaler before exercising. Many people with asthma experience exercise-induced bronchoconstriction (EIB). This condition often worsens during vigorous exercise in cold, humid, or dry environments. Usually, people with EIB can stay very active by pre-treating with a fast-acting inhaler before exercising.   This information is not intended to replace advice given to you by your health care provider. Make sure you discuss any questions you have with your health care provider.   Document Released: 03/05/2009 Document Revised: 12/06/2014 Document Reviewed: 08/17/2014 Elsevier Interactive Patient Education [employer]2016 Elsevier Inc.

## 2016-08-26 ENCOUNTER — Encounter (HOSPITAL_COMMUNITY): Payer: Self-pay

## 2016-08-26 ENCOUNTER — Emergency Department (HOSPITAL_COMMUNITY)
Admission: EM | Admit: 2016-08-26 | Discharge: 2016-08-26 | Disposition: A | Discharge status: Home / Self Care | Payer: Medicaid Other | Attending: Emergency Medicine | Admitting: Emergency Medicine

## 2016-08-26 DIAGNOSIS — G809 Cerebral palsy, unspecified: Secondary | ICD-10-CM | POA: Diagnosis not present

## 2016-08-26 DIAGNOSIS — L237 Allergic contact dermatitis due to plants, except food: Secondary | ICD-10-CM | POA: Diagnosis not present

## 2016-08-26 DIAGNOSIS — J45909 Unspecified asthma, uncomplicated: Secondary | ICD-10-CM | POA: Insufficient documentation

## 2016-08-26 DIAGNOSIS — I10 Essential (primary) hypertension: Secondary | ICD-10-CM | POA: Insufficient documentation

## 2016-08-26 DIAGNOSIS — Z8673 Personal history of transient ischemic attack (TIA), and cerebral infarction without residual deficits: Secondary | ICD-10-CM | POA: Diagnosis not present

## 2016-08-26 DIAGNOSIS — R21 Rash and other nonspecific skin eruption: Secondary | ICD-10-CM | POA: Diagnosis present

## 2016-08-26 DIAGNOSIS — F17210 Nicotine dependence, cigarettes, uncomplicated: Secondary | ICD-10-CM | POA: Insufficient documentation

## 2016-08-26 MED ORDER — DIPHENHYDRAMINE HCL 25 MG PO TABS: 25.0000 mg | ORAL_TABLET | Freq: Four times a day (QID) | ORAL | 0 refills | Status: AC | PRN

## 2016-08-26 MED ORDER — PREDNISONE 20 MG PO TABS: ORAL_TABLET | ORAL | 0 refills | Status: DC

## 2016-08-26 MED ORDER — TRIAMCINOLONE ACETONIDE 0.1 % EX CREA: 1.0000 "application " | TOPICAL_CREAM | Freq: Two times a day (BID) | CUTANEOUS | 0 refills | Status: DC

## 2016-08-26 NOTE — ED Triage Notes (Signed)
Per Pt, Pt is coming from home with complaints of rash noted to right arm abdomen and right buttocks. 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  Complains of discomfort. Pt was supposed to be at court today, but was told by lawyer to come and get checked out.

## 2016-08-26 NOTE — Discharge Instructions (Signed)
Take the prescribed medication as directed.  Can continue using calamine lotion, oatmeal bath, etc. Follow-up with your primary care doctor if any ongoing issues. Return to the ED for new or worsening symptoms.

## 2016-08-26 NOTE — ED Provider Notes (Signed)
[facility]-EMERGENCY DEPT Provider Note   CSN: [id number] Arrival date & time: 08/26/16  1044  By signing my name below, I, Troy Phillips, attest that this documentation has been prepared under the direction and in the presence of [name], PA-C Electronically Signed: Deland Phillips, ED Scribe. 08/26/16. 11:30 AM.  History   Chief Complaint Chief Complaint  Patient presents with  . Rash   The history is provided by the patient. No language interpreter was used.   HPI Comments: Troy Phillips is a 31 y.o. male who presents to the Emergency Department complaining of discomfort and itching to the right arm, abdomen and buttocks. 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 Pt has associated mild drainage, swelling and redness, and at the site. He adds that he has been applying calamine lotion with mild relief.   Past Medical History:  Diagnosis Date  . Asthma   . Cerebral palsy (HCC)   . Depression   . Hypertension   . Stroke [facility])    at birth    Patient Active Problem List   Diagnosis Date Noted  . Acute asthma exacerbation 10/17/2012  . Hypertension 10/17/2012  . Cerebral palsy (HCC) 04/25/2011    History reviewed. No pertinent surgical history.     Home Medications    Prior to Admission medications   Medication Sig Start Date End Date Taking? Authorizing Provider  predniSONE (DELTASONE) 20 MG tablet Take 3 tablets (60 mg total) by mouth daily. 07/21/15   Tomasita Crumble, MD    Family History No family history on file.  Social History Social History  Substance Use Topics  . Smoking status: Current Every Day Smoker    Packs/day: 0.50    Types: Cigarettes  . Smokeless tobacco: Never Used  . Alcohol use Yes     Allergies   Patient has no known allergies.   Review of Systems Review of Systems  Skin: Positive for rash.  All other systems reviewed and are negative.    Physical Exam Updated Vital Signs BP (!)  152/97 (BP Location: Left Arm)   Pulse 76   Temp 97.5 F (36.4 C) (Oral)   Resp 16   Ht 6\' 3"  (1.905 m)   Wt (!) 310 lb (140.6 kg)   SpO2 97%   BMI 38.75 kg/m   Physical Exam  Constitutional: He is oriented to person, place, and time. He appears well-developed and well-nourished.  HENT:  Head: Normocephalic and atraumatic.  Mouth/Throat: Oropharynx is clear and moist.  No oral or facial swelling, no airway compromise, handling secretions well  Eyes: Conjunctivae and EOM are normal. Pupils are equal, round, and reactive to light.  Neck: Normal range of motion.  Cardiovascular: Normal rate, regular rhythm and normal heart sounds.   Pulmonary/Chest: Effort normal and breath sounds normal.  Abdominal: Soft. Bowel sounds are normal.  Musculoskeletal: Normal range of motion.  Neurological: He is alert and oriented to person, place, and time.  Skin: Skin is warm and dry.  Areas of rash noted to right forearm, right abdomen, right cheek, and posterior neck with small vesicles in chains consistent with poison ivy, there are signs of excoriation but no signs of superimposed infection or cellulitis There is some calamine lotion present on the abdomen  Psychiatric: He has a normal mood and affect.  Nursing note and vitals reviewed.    ED Treatments / Results   DIAGNOSTIC STUDIES: Oxygen Saturation is 97% on RA, adequate by  my interpretation.   COORDINATION OF CARE: 11:20 AM-Discussed next steps with pt. Pt verbalized understanding and is agreeable with the plan.   Labs (all labs ordered are listed, but only abnormal results are displayed) Labs Reviewed - No data to display  EKG  EKG Interpretation None       Radiology No results found.  Procedures Procedures (including critical care time)  Medications Ordered in ED Medications - No data to display   Initial Impression / Assessment and Plan / ED Course  I have reviewed the triage vital signs and the nursing  notes.  Pertinent labs & imaging results that were available during my care of the patient were reviewed by me and considered in my medical decision making (see chart for details).  31 year old male here with rash. Affected areas include right forearm, right side of abdomen, right cheek, and posterior neck. Rash is consistent with poison ivy. There is no significant facial swelling, oral swelling, or airway compromise. 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 Has been using calamine lotion. Given multiple areas affected including facial surfaces, will start on oral prednisone and Benadryl. Topical Kenalog also prescribed. Will have him follow-up closely with PCP.  Discussed plan with patient, he acknowledged understanding and agreed with plan of care.  Return precautions given for new or worsening symptoms.  Final Clinical Impressions(s) / ED Diagnoses   Final diagnoses:  Poison ivy    New Prescriptions Discharge Medication List as of 08/26/2016 11:41 AM    START taking these medications   Details  diphenhydrAMINE (BENADRYL) 25 MG tablet Take 1 tablet (25 mg total) by mouth every 6 (six) hours as needed., Starting Tue 08/26/2016, Print    triamcinolone cream (KENALOG) 0.1 % Apply 1 application topically 2 (two) times daily., Starting Tue 08/26/2016, Print       I personally performed the services described in this documentation, which was scribed in my presence. The recorded information has been reviewed and is accurate.    Garlon HatchetSanders, Avarose Mervine M, PA-C 08/26/16 1154    Benjiman CorePickering, Nathan, MD 08/26/16 1520

## 2017-07-10 ENCOUNTER — Emergency Department (HOSPITAL_COMMUNITY)
Admission: EM | Admit: 2017-07-10 | Discharge: 2017-07-10 | Disposition: A | Discharge status: Home / Self Care | Payer: Medicaid Other | Attending: Emergency Medicine | Admitting: Emergency Medicine

## 2017-07-10 ENCOUNTER — Encounter (HOSPITAL_COMMUNITY): Payer: Self-pay | Admitting: Emergency Medicine

## 2017-07-10 ENCOUNTER — Emergency Department (HOSPITAL_COMMUNITY): Payer: Medicaid Other

## 2017-07-10 DIAGNOSIS — R0789 Other chest pain: Secondary | ICD-10-CM | POA: Diagnosis not present

## 2017-07-10 DIAGNOSIS — R1030 Lower abdominal pain, unspecified: Secondary | ICD-10-CM | POA: Insufficient documentation

## 2017-07-10 DIAGNOSIS — J209 Acute bronchitis, unspecified: Secondary | ICD-10-CM

## 2017-07-10 DIAGNOSIS — R062 Wheezing: Secondary | ICD-10-CM | POA: Insufficient documentation

## 2017-07-10 DIAGNOSIS — F17210 Nicotine dependence, cigarettes, uncomplicated: Secondary | ICD-10-CM | POA: Insufficient documentation

## 2017-07-10 DIAGNOSIS — J4541 Moderate persistent asthma with (acute) exacerbation: Secondary | ICD-10-CM

## 2017-07-10 DIAGNOSIS — I10 Essential (primary) hypertension: Secondary | ICD-10-CM | POA: Insufficient documentation

## 2017-07-10 DIAGNOSIS — R0981 Nasal congestion: Secondary | ICD-10-CM | POA: Diagnosis not present

## 2017-07-10 DIAGNOSIS — R05 Cough: Secondary | ICD-10-CM | POA: Diagnosis present

## 2017-07-10 DIAGNOSIS — G809 Cerebral palsy, unspecified: Secondary | ICD-10-CM | POA: Insufficient documentation

## 2017-07-10 MED ORDER — ALBUTEROL SULFATE HFA 108 (90 BASE) MCG/ACT IN AERS
2.0000 | INHALATION_SPRAY | RESPIRATORY_TRACT | Status: DC
  Administered 2017-07-10: 2 via RESPIRATORY_TRACT
  Filled 2017-07-10: qty 6.7

## 2017-07-10 MED ORDER — AEROCHAMBER PLUS FLO-VU MEDIUM MISC
1.0000 | Freq: Once | Status: AC
Start: 2017-07-10 — End: 2017-07-10
  Administered 2017-07-10: 1
  Filled 2017-07-10: qty 1

## 2017-07-10 MED ORDER — AZITHROMYCIN 250 MG PO TABS: ORAL_TABLET | ORAL | 0 refills | Status: DC

## 2017-07-10 MED ORDER — PREDNISONE 20 MG PO TABS: ORAL_TABLET | ORAL | 0 refills | Status: DC

## 2017-07-10 MED ORDER — IPRATROPIUM-ALBUTEROL 0.5-2.5 (3) MG/3ML IN SOLN
3.0000 mL | Freq: Once | RESPIRATORY_TRACT | Status: AC
  Administered 2017-07-10: 3 mL via RESPIRATORY_TRACT
  Filled 2017-07-10: qty 3

## 2017-07-10 MED ORDER — ACETAMINOPHEN 500 MG PO TABS
1000.0000 mg | ORAL_TABLET | Freq: Once | ORAL | Status: AC
  Administered 2017-07-10: 1000 mg via ORAL
  Filled 2017-07-10: qty 2

## 2017-07-10 MED ORDER — ACETAMINOPHEN 500 MG PO TABS: 1000.0000 mg | ORAL_TABLET | Freq: Four times a day (QID) | ORAL | 0 refills | Status: DC | PRN

## 2017-07-10 MED ORDER — ALBUTEROL SULFATE (2.5 MG/3ML) 0.083% IN NEBU
5.0000 mg | INHALATION_SOLUTION | Freq: Once | RESPIRATORY_TRACT | Status: AC
  Administered 2017-07-10: 5 mg via RESPIRATORY_TRACT
  Filled 2017-07-10: qty 6

## 2017-07-10 MED ORDER — PREDNISONE 20 MG PO TABS
60.0000 mg | ORAL_TABLET | Freq: Once | ORAL | Status: AC
  Administered 2017-07-10: 60 mg via ORAL
  Filled 2017-07-10: qty 3

## 2017-07-10 NOTE — ED Triage Notes (Signed)
Pt has asthma and out of his medications and had cough for several days. Reports hot sweats and chills.

## 2017-07-10 NOTE — ED Provider Notes (Signed)
[facility]-EMERGENCY DEPT Provider Note   CSN: [id number] Arrival date & time: 07/10/17  1447     History   Chief Complaint Chief Complaint  Patient presents with  . Asthma  . Cough    HPI Troy Phillips is a 32 y.o. male.  HPI Patient has had a cough for about 3 days.  Symptoms also started with nasal congestion and drainage.  Patient reports he has been getting hot and cold chills.  He has not been measuring his temperature.  He reports he has been coughing up yellow sputum.  Reports he has had chest pain with coughing.  It is bilateral.  No lower extremity calf pain or swelling.  Patient had run out of his inhaler. Past Medical History:  Diagnosis Date  . Asthma   . Cerebral palsy (HCC)   . Depression   . Hypertension   . 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 Acute asthma exacerbation 10/17/2012  . Hypertension 10/17/2012  . Cerebral palsy (HCC) 04/25/2011    History reviewed. No pertinent surgical history.      Home Medications    Prior to Admission medications   Medication Sig Start Date End Date Taking? Authorizing Provider  albuterol (PROVENTIL HFA;VENTOLIN HFA) 108 (90 Base) MCG/ACT inhaler Inhale 2 puffs into the lungs every 6 (six) hours as needed for wheezing or shortness of breath.   Yes [provider]  acetaminophen (TYLENOL) 500 MG tablet Take 2 tablets (1,000 mg total) by mouth every 6 (six) hours as needed. 07/10/17   Arby Barrette, MD  azithromycin (ZITHROMAX Z-PAK) 250 MG tablet 2 po day one, then 1 daily x 4 days 07/10/17   Arby Barrette, MD  diphenhydrAMINE (BENADRYL) 25 MG tablet Take 1 tablet (25 mg total) by mouth every 6 (six) hours as needed. Patient not taking: Reported on 07/10/2017 08/26/16   Garlon Hatchet, PA-C  predniSONE (DELTASONE) 20 MG tablet Take 40 mg by mouth daily for 3 days, then 20mg  by mouth daily for 3 days, then 10mg  daily for 3 days Patient  not taking: Reported on 07/10/2017 08/26/16   Garlon Hatchet, PA-C  predniSONE (DELTASONE) 20 MG tablet 3 tabs po day one, then 2 tabs daily x 4 days 07/10/17   Arby Barrette, MD  triamcinolone cream (KENALOG) 0.1 % Apply 1 application topically 2 (two) times daily. Patient not taking: Reported on 07/10/2017 08/26/16   Garlon Hatchet, PA-C    Family History No family history on file.  Social History Social History   Tobacco Use  . Smoking status: Current Every Day Smoker    Packs/day: 0.50    Types: Cigarettes  . Smokeless tobacco: Never Used  Substance Use Topics  . Alcohol use: Yes  . Drug use: No     Allergies   Patient has no known allergies.   Review of Systems Review of Systems  10 Systems reviewed and are negative for acute change except as noted in the HPI.  Physical Exam Updated Vital Signs BP (!) 148/87 (BP Location: Left Arm)   Pulse 100   Temp (!) 97.5 F (36.4 C) (Oral)   Resp 20   Ht 6\' 3"  (1.905 m)   Wt (!) 142.9 kg (315 lb)   SpO2 92%   BMI 39.37 kg/m   Physical Exam  Constitutional: He is oriented to person, place, and time.  Patient is alert and  appropriate.  He does not have respiratory distress at rest.  He does have intermittent harsh cough.  Morbid obesity.  HENT:  Head: Normocephalic and atraumatic.  Posterior airway widely patent.  No tonsillar erythema or exudate.  Eyes: EOM are normal.  Neck: Neck supple.  Cardiovascular: Normal rate, regular rhythm, normal heart sounds and intact distal pulses.  Pulmonary/Chest:  No respiratory distress.  Patient does however have coarse diffuse wheezing throughout the lung fields.  Fair airflow to the bases.  Cough with deep inspiration.  Abdominal:  Patient endorses some diffuse upper abdominal and lower chest wall palpation tenderness.  No lower tenderness or guarding.  Chest wall is normal.  No crepitus no rashes.  Musculoskeletal: Normal range of motion. He exhibits no edema or tenderness.    Neurological: He is alert and oriented to person, place, and time. No cranial nerve deficit. He exhibits normal muscle tone. Coordination normal.  Skin: Skin is warm and dry.  Psychiatric: He has a normal mood and affect.     ED Treatments / Results  Labs (all labs ordered are listed, but only abnormal results are displayed) Labs Reviewed - No data to display  EKG None  Radiology Dg Chest 2 View  Result Date: 07/10/2017 CLINICAL DATA:  Cough EXAM: CHEST - 2 VIEW COMPARISON:  07/20/2015 FINDINGS: The heart size and mediastinal contours are within normal limits. Both lungs are clear. The visualized skeletal structures are unremarkable. IMPRESSION: No active cardiopulmonary disease. Electronically Signed   By: Jasmine PangKim  Fujinaga M.D.   On: 07/10/2017 17:22    Procedures Procedures (including critical care time)  Medications Ordered in ED Medications  albuterol (PROVENTIL HFA;VENTOLIN HFA) 108 (90 Base) MCG/ACT inhaler 2 puff (2 puffs Inhalation Given 07/10/17 1825)  AEROCHAMBER PLUS FLO-VU MEDIUM MISC 1 each (has no administration in time range)  albuterol (PROVENTIL) (2.5 MG/3ML) 0.083% nebulizer solution 5 mg (5 mg Nebulization Given 07/10/17 1506)  ipratropium-albuterol (DUONEB) 0.5-2.5 (3) MG/3ML nebulizer solution 3 mL (3 mLs Nebulization Given 07/10/17 1702)  predniSONE (DELTASONE) tablet 60 mg (60 mg Oral Given 07/10/17 1700)  acetaminophen (TYLENOL) tablet 1,000 mg (1,000 mg Oral Given 07/10/17 1820)     Initial Impression / Assessment and Plan / ED Course  I have reviewed the triage vital signs and the nursing notes.  Pertinent labs & imaging results that were available during my care of the patient were reviewed by me and considered in my medical decision making (see chart for details).      Final Clinical Impressions(s) / ED Diagnoses   Final diagnoses:  Moderate persistent asthma with exacerbation  Acute bronchitis, unspecified organism  Presents with history of asthma  and out of albuterol.  He has developed productive cough and cough with chest pain.  He decreasing hot cold episodes.  This time will prescribe Zithromax for asthmatic with bronchitis type picture but possible atypical pneumonia.  She is alert and appropriate.  He does not have respiratory distress at rest.  He does have diffuse wheezing.  He had significant improvement with DuoNeb treatment.  Plan is to start outpatient treatment.  Patient is counseled on necessity to return immediately if symptoms should worsen or change.  ED Discharge Orders        Ordered    azithromycin (ZITHROMAX Z-PAK) 250 MG tablet     07/10/17 1825    predniSONE (DELTASONE) 20 MG tablet     07/10/17 1825    acetaminophen (TYLENOL) 500 MG tablet  Every 6 hours PRN  07/10/17 1825       Arby Barrette, MD 07/10/17 1610

## 2017-07-10 NOTE — ED Notes (Signed)
Pt ambulated independently maintaining an oxygen saturation between 96-98%

## 2017-07-10 NOTE — Discharge Instructions (Addendum)
1.  Start your prednisone tonight before you go to bed.  Also start your azithromycin.  Use your inhaler every 4 hours with a spacer.  After 2 days of using the inhaler, you can return to as needed use.  Take Tylenol every 6 hours for body aches or fever. 2.  Make an appointment to see your family doctor soon as possible at the beginning of the week. 3.  Return to the emergency department if your symptoms are worsening.

## 2017-07-10 NOTE — ED Notes (Signed)
Patient transported to X-ray 

## 2017-08-03 ENCOUNTER — Encounter (HOSPITAL_COMMUNITY): Payer: Self-pay | Admitting: *Deleted

## 2017-08-03 ENCOUNTER — Emergency Department (HOSPITAL_COMMUNITY): Payer: Medicaid Other

## 2017-08-03 ENCOUNTER — Other Ambulatory Visit: Payer: Self-pay

## 2017-08-03 ENCOUNTER — Emergency Department (HOSPITAL_COMMUNITY)
Admission: EM | Admit: 2017-08-03 | Discharge: 2017-08-03 | Disposition: A | Discharge status: Home / Self Care | Payer: Medicaid Other | Attending: Emergency Medicine | Admitting: Emergency Medicine

## 2017-08-03 DIAGNOSIS — I10 Essential (primary) hypertension: Secondary | ICD-10-CM | POA: Insufficient documentation

## 2017-08-03 DIAGNOSIS — M7918 Myalgia, other site: Secondary | ICD-10-CM

## 2017-08-03 DIAGNOSIS — J45909 Unspecified asthma, uncomplicated: Secondary | ICD-10-CM | POA: Diagnosis not present

## 2017-08-03 DIAGNOSIS — F17210 Nicotine dependence, cigarettes, uncomplicated: Secondary | ICD-10-CM | POA: Diagnosis not present

## 2017-08-03 DIAGNOSIS — M25511 Pain in right shoulder: Secondary | ICD-10-CM | POA: Insufficient documentation

## 2017-08-03 DIAGNOSIS — Z79899 Other long term (current) drug therapy: Secondary | ICD-10-CM | POA: Insufficient documentation

## 2017-08-03 MED ORDER — CYCLOBENZAPRINE HCL 10 MG PO TABS: 10.0000 mg | ORAL_TABLET | Freq: Two times a day (BID) | ORAL | 0 refills | Status: DC | PRN
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
## 2017-08-03 NOTE — Discharge Instructions (Addendum)
Please read attached information. If you experience any new or worsening signs or symptoms please return to the emergency room for evaluation. Please follow-up with your primary care provider or specialist as discussed. Please use medication prescribed only as directed and discontinue taking if you have any concerning signs or symptoms.   °
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
## 2018-04-15 ENCOUNTER — Emergency Department (HOSPITAL_COMMUNITY): Payer: Medicaid Other

## 2018-04-15 ENCOUNTER — Other Ambulatory Visit: Payer: Self-pay

## 2018-04-15 ENCOUNTER — Emergency Department (HOSPITAL_COMMUNITY)
Admission: EM | Admit: 2018-04-15 | Discharge: 2018-04-15 | Disposition: A | Discharge status: Home / Self Care | Payer: Medicaid Other | Attending: Emergency Medicine | Admitting: Emergency Medicine

## 2018-04-15 ENCOUNTER — Encounter (HOSPITAL_COMMUNITY): Payer: Self-pay

## 2018-04-15 DIAGNOSIS — Y939 Activity, unspecified: Secondary | ICD-10-CM | POA: Insufficient documentation

## 2018-04-15 DIAGNOSIS — R109 Unspecified abdominal pain: Secondary | ICD-10-CM | POA: Diagnosis not present

## 2018-04-15 DIAGNOSIS — J45909 Unspecified asthma, uncomplicated: Secondary | ICD-10-CM | POA: Diagnosis not present

## 2018-04-15 DIAGNOSIS — Y929 Unspecified place or not applicable: Secondary | ICD-10-CM | POA: Insufficient documentation

## 2018-04-15 DIAGNOSIS — I10 Essential (primary) hypertension: Secondary | ICD-10-CM | POA: Diagnosis not present

## 2018-04-15 DIAGNOSIS — Y999 Unspecified external cause status: Secondary | ICD-10-CM | POA: Insufficient documentation

## 2018-04-15 DIAGNOSIS — F17210 Nicotine dependence, cigarettes, uncomplicated: Secondary | ICD-10-CM | POA: Insufficient documentation

## 2018-04-15 DIAGNOSIS — Z79899 Other long term (current) drug therapy: Secondary | ICD-10-CM | POA: Insufficient documentation

## 2018-04-15 DIAGNOSIS — R079 Chest pain, unspecified: Secondary | ICD-10-CM | POA: Diagnosis present

## 2018-04-15 DIAGNOSIS — W19XXXA Unspecified fall, initial encounter: Secondary | ICD-10-CM | POA: Diagnosis not present

## 2018-04-15 DIAGNOSIS — S20211A Contusion of right front wall of thorax, initial encounter: Secondary | ICD-10-CM

## 2018-04-15 LAB — CBC WITH DIFFERENTIAL/PLATELET
ABS IMMATURE GRANULOCYTES: 0.06 10*3/uL (ref 0.00–0.07)
BASOS PCT: 1 %
Basophils Absolute: 0.1 10*3/uL (ref 0.0–0.1)
EOS ABS: 0.5 10*3/uL (ref 0.0–0.5)
Eosinophils Relative: 3 %
HCT: 47.3 % (ref 39.0–52.0)
Hemoglobin: 14.9 g/dL (ref 13.0–17.0)
Immature Granulocytes: 0 %
Lymphocytes Relative: 22 %
Lymphs Abs: 3 10*3/uL (ref 0.7–4.0)
MCH: 27.2 pg (ref 26.0–34.0)
MCHC: 31.5 g/dL (ref 30.0–36.0)
MCV: 86.3 fL (ref 80.0–100.0)
MONO ABS: 0.7 10*3/uL (ref 0.1–1.0)
MONOS PCT: 5 %
Neutro Abs: 9.2 10*3/uL — ABNORMAL HIGH (ref 1.7–7.7)
Neutrophils Relative %: 69 %
PLATELETS: 269 10*3/uL (ref 150–400)
RBC: 5.48 MIL/uL (ref 4.22–5.81)
RDW: 14.5 % (ref 11.5–15.5)
WBC: 13.5 10*3/uL — ABNORMAL HIGH (ref 4.0–10.5)
nRBC: 0 % (ref 0.0–0.2)

## 2018-04-15 LAB — URINALYSIS, ROUTINE W REFLEX MICROSCOPIC
Bilirubin Urine: NEGATIVE
GLUCOSE, UA: NEGATIVE mg/dL
Ketones, ur: NEGATIVE mg/dL
Leukocytes, UA: NEGATIVE
NITRITE: NEGATIVE
PH: 6 (ref 5.0–8.0)
PROTEIN: 100 mg/dL — AB

## 2018-04-15 LAB — COMPREHENSIVE METABOLIC PANEL
ALBUMIN: 4 g/dL (ref 3.5–5.0)
ALK PHOS: 54 U/L (ref 38–126)
ALT: 32 U/L (ref 0–44)
AST: 39 U/L (ref 15–41)
Anion gap: 9 (ref 5–15)
BILIRUBIN TOTAL: 1.1 mg/dL (ref 0.3–1.2)
BUN: 14 mg/dL (ref 6–20)
CALCIUM: 9.2 mg/dL (ref 8.9–10.3)
CO2: 25 mmol/L (ref 22–32)
CREATININE: 1.3 mg/dL — AB (ref 0.61–1.24)
Chloride: 105 mmol/L (ref 98–111)
GFR calc Af Amer: >60 mL/min (ref >60)
GFR calc non Af Amer: >60 mL/min (ref >60)
GLUCOSE: 126 mg/dL — AB (ref 70–99)
Potassium: 3.7 mmol/L (ref 3.5–5.1)
Sodium: 139 mmol/L (ref 135–145)
TOTAL PROTEIN: 7.4 g/dL (ref 6.5–8.1)

## 2018-04-15 MED ORDER — SODIUM CHLORIDE 0.9 % IV BOLUS
1000.0000 mL | Freq: Once | INTRAVENOUS | Status: AC
  Administered 2018-04-15: 1000 mL via INTRAVENOUS

## 2018-04-15 MED ORDER — ONDANSETRON HCL 4 MG/2ML IJ SOLN
4.0000 mg | Freq: Once | INTRAMUSCULAR | Status: AC
  Administered 2018-04-15: 4 mg via INTRAVENOUS
  Filled 2018-04-15: qty 2

## 2018-04-15 MED ORDER — HYDROMORPHONE HCL 1 MG/ML IJ SOLN
1.0000 mg | Freq: Once | INTRAMUSCULAR | Status: AC
  Administered 2018-04-15: 1 mg via INTRAVENOUS
  Filled 2018-04-15: qty 1

## 2018-04-15 MED ORDER — HYDROCODONE-ACETAMINOPHEN 5-325 MG PO TABS: 1.0000 | ORAL_TABLET | ORAL | 0 refills | Status: DC | PRN

## 2018-04-15 MED ORDER — HYDROMORPHONE HCL 1 MG/ML IJ SOLN
0.5000 mg | Freq: Once | INTRAMUSCULAR | Status: AC
  Administered 2018-04-15: 0.5 mg via INTRAVENOUS
  Filled 2018-04-15: qty 1

## 2018-04-15 MED ORDER — IOHEXOL 300 MG/ML  SOLN
100.0000 mL | Freq: Once | INTRAMUSCULAR | Status: AC | PRN
  Administered 2018-04-15: 100 mL via INTRAVENOUS

## 2018-04-15 NOTE — ED Notes (Signed)
ED Provider at bedside. 

## 2018-04-15 NOTE — ED Notes (Signed)
Patient transported to CT 
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
## 2018-04-15 NOTE — ED Provider Notes (Signed)
[facility] Provider Note   CSN: [id number] Arrival date & time: 04/15/18  [id number]     History   Chief Complaint Chief Complaint  Patient presents with  . Fall    HPI Troy Phillips is a 33 y.o. male.  Pt presents to the ED today with right sided chest wall and right abdominal pain.  Pt said, 2 days ago,  his dog tried to run away, so he ran after it.  He jumped over a fence and landed with his right chest on a tree trunk.  He has been having pain, but this morning was horrible.  When EMS arrived he was tachycardic in the 130s and diaphoretic.  He denies hitting his head or neck pain.  No loc. No n/v or dizziness.  EMS gave him 100 mcg fentanyl IV and that has helped him feel better, but pain is still there.  He is no longer tachycardic.     Past Medical History:  Diagnosis Date  . Asthma   . Cerebral palsy (HCC)   . Depression   . Hypertension   . 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 Acute asthma exacerbation 10/17/2012  . Hypertension 10/17/2012  . Cerebral palsy (HCC) 04/25/2011    History reviewed. No pertinent surgical history.      Home Medications    Prior to Admission medications   Medication Sig Start Date End Date Taking? Authorizing Provider  albuterol (PROVENTIL HFA;VENTOLIN HFA) 108 (90 Base) MCG/ACT inhaler Inhale 2 puffs into the lungs every 6 (six) hours as needed for wheezing or shortness of breath.   Yes [provider]  acetaminophen (TYLENOL) 500 MG tablet Take 2 tablets (1,000 mg total) by mouth every 6 (six) hours as needed. Patient not taking: Reported on 04/15/2018 07/10/17   Arby BarrettePfeiffer, Marcy, MD  cyclobenzaprine (FLEXERIL) 10 MG tablet Take 1 tablet (10 mg total) by mouth 2 (two) times daily as needed for muscle spasms. Patient not taking: Reported on 04/15/2018 08/03/17   Hedges, Tinnie GensJeffrey, PA-C  diphenhydrAMINE (BENADRYL) 25 MG tablet Take 1 tablet (25  mg total) by mouth every 6 (six) hours as needed. Patient not taking: Reported on 07/10/2017 08/26/16   Garlon HatchetSanders, Lisa M, PA-C  HYDROcodone-acetaminophen (NORCO/VICODIN) 5-325 MG tablet Take 1 tablet by mouth every 4 (four) hours as needed. 04/15/18   Jacalyn LefevreHaviland, Lariya Kinzie, MD  triamcinolone cream (KENALOG) 0.1 % Apply 1 application topically 2 (two) times daily. Patient not taking: Reported on 07/10/2017 08/26/16   Garlon HatchetSanders, Lisa M, PA-C    Family History History reviewed. No pertinent family history.  Social History Social History   Tobacco Use  . Smoking status: Current Every Day Smoker    Packs/day: 0.50    Types: Cigarettes  . Smokeless tobacco: Never Used  Substance Use Topics  . Alcohol use: Yes  . Drug use: No     Allergies   Patient has no known allergies.   Review of Systems Review of Systems  Cardiovascular: Positive for chest pain.  Gastrointestinal: Positive for abdominal pain.  All other systems reviewed and are negative.    Physical Exam Updated Vital Signs BP 123/63   Pulse (!) 55   Temp 97.6 F (36.4 C) (Oral)   Resp 17   Ht 6\' 3"  (1.905 m)   Wt (!) 142.9 kg   SpO2 98%   BMI 39.37 kg/m   Physical Exam Vitals  signs and nursing note reviewed.  Constitutional:      Appearance: Normal appearance. He is obese.  HENT:     Head: Normocephalic and atraumatic.     Right Ear: External ear normal.     Left Ear: External ear normal.     Nose: Nose normal.     Mouth/Throat:     Mouth: Mucous membranes are moist.     Pharynx: Oropharynx is clear.  Eyes:     Extraocular Movements: Extraocular movements intact.     Conjunctiva/sclera: Conjunctivae normal.     Pupils: Pupils are equal, round, and reactive to light.  Neck:     Musculoskeletal: Normal range of motion and neck supple.  Cardiovascular:     Rate and Rhythm: Normal rate and regular rhythm.     Pulses: Normal pulses.     Heart sounds: Normal heart sounds.  Pulmonary:     Effort: Pulmonary effort is  normal.     Breath sounds: Normal breath sounds.  Chest:    Abdominal:     General: Abdomen is flat. Bowel sounds are normal.     Palpations: Abdomen is soft.    Musculoskeletal: Normal range of motion.  Skin:    General: Skin is warm and dry.     Capillary Refill: Capillary refill takes less than 2 seconds.  Neurological:     General: No focal deficit present.     Mental Status: He is alert and oriented to person, place, and time.  Psychiatric:        Mood and Affect: Mood normal.        Behavior: Behavior normal.      ED Treatments / Results  Labs (all labs ordered are listed, but only abnormal results are displayed) Labs Reviewed  COMPREHENSIVE METABOLIC PANEL - Abnormal; Notable for the following components:      Result Value   Glucose, Bld 126 (*)    Creatinine, Ser 1.30 (*)    All other components within normal limits  CBC WITH DIFFERENTIAL/PLATELET - Abnormal; Notable for the following components:   WBC 13.5 (*)    Neutro Abs 9.2 (*)    All other components within normal limits  URINALYSIS, ROUTINE W REFLEX MICROSCOPIC - Abnormal; Notable for the following components:   Specific Gravity, Urine >1.046 (*)    Hgb urine dipstick SMALL (*)    Protein, ur 100 (*)    Bacteria, UA RARE (*)    All other components within normal limits    EKG None  Radiology Ct Chest W Contrast  Result Date: 04/15/2018 CLINICAL DATA:  Recent fall with right-sided chest and abdominal pain, initial encounter EXAM: CT CHEST, ABDOMEN, AND PELVIS WITH CONTRAST TECHNIQUE: Multidetector CT imaging of the chest, abdomen and pelvis was performed following the standard protocol during bolus administration of intravenous contrast. CONTRAST:  100mL OMNIPAQUE 300 COMPARISON:  None. FINDINGS: CT CHEST FINDINGS Cardiovascular: No significant vascular findings. Normal heart size. No pericardial effusion. Mediastinum/Nodes: No enlarged mediastinal, hilar, or axillary lymph nodes. Thyroid gland,  trachea, and esophagus demonstrate no significant findings. Lungs/Pleura: Lungs are well aerated bilaterally without focal infiltrate, effusion or pneumothorax. Minimal atelectatic changes are noted bilaterally. Musculoskeletal: No chest wall mass or suspicious bone lesions identified. CT ABDOMEN PELVIS FINDINGS Hepatobiliary: No focal liver abnormality is seen. No gallstones, gallbladder wall thickening, or biliary dilatation. Pancreas: Unremarkable. No pancreatic ductal dilatation or surrounding inflammatory changes. Spleen: Normal in size without focal abnormality. Adrenals/Urinary Tract: Adrenal glands are within normal limits. Kidneys  are well visualized bilaterally without renal calculi. Delayed images demonstrate normal excretion of contrast material. The bladder is decompressed. Stomach/Bowel: Stomach is within normal limits. Appendix appears normal. No evidence of bowel wall thickening, distention, or inflammatory changes. Vascular/Lymphatic: No significant vascular findings are present. No enlarged abdominal or pelvic lymph nodes. Reproductive: Prostate is unremarkable. Other: No abdominal wall hernia or abnormality. No abdominopelvic ascites. Musculoskeletal: No acute or significant osseous findings. IMPRESSION: No acute abnormality noted. Electronically Signed   By: Alcide Clever M.D.   On: 04/15/2018 10:08   Ct Abdomen Pelvis W Contrast  Result Date: 04/15/2018 CLINICAL DATA:  Recent fall with right-sided chest and abdominal pain, initial encounter EXAM: CT CHEST, ABDOMEN, AND PELVIS WITH CONTRAST TECHNIQUE: Multidetector CT imaging of the chest, abdomen and pelvis was performed following the standard protocol during bolus administration of intravenous contrast. CONTRAST:  OMNIPAQUE 300 COMPARISON:  None. FINDINGS: CT CHEST FINDINGS Cardiovascular: No significant vascular findings. Normal heart size. No pericardial effusion. Mediastinum/Nodes: No enlarged mediastinal, hilar, or axillary lymph  nodes. Thyroid gland, trachea, and esophagus demonstrate no significant findings. Lungs/Pleura: Lungs are well aerated bilaterally without focal infiltrate, effusion or pneumothorax. Minimal atelectatic changes are noted bilaterally. Musculoskeletal: No chest wall mass or suspicious bone lesions identified. CT ABDOMEN PELVIS FINDINGS Hepatobiliary: No focal liver abnormality is seen. No gallstones, gallbladder wall thickening, or biliary dilatation. Pancreas: Unremarkable. No pancreatic ductal dilatation or surrounding inflammatory changes. Spleen: Normal in size without focal abnormality. Adrenals/Urinary Tract: Adrenal glands are within normal limits. Kidneys are well visualized bilaterally without renal calculi. Delayed images demonstrate normal excretion of contrast material. The bladder is decompressed. Stomach/Bowel: Stomach is within normal limits. Appendix appears normal. No evidence of bowel wall thickening, distention, or inflammatory changes. Vascular/Lymphatic: No significant vascular findings are present. No enlarged abdominal or pelvic lymph nodes. Reproductive: Prostate is unremarkable. Other: No abdominal wall hernia or abnormality. No abdominopelvic ascites. Musculoskeletal: No acute or significant osseous findings. IMPRESSION: No acute abnormality noted. Electronically Signed   By: Alcide Clever M.D.   On: 04/15/2018 10:08    Procedures Procedures (including critical care time)  Medications Ordered in ED Medications  HYDROmorphone (DILAUDID) injection 1 mg (1 mg Intravenous Given 04/15/18 0811)  ondansetron (ZOFRAN) injection 4 mg (4 mg Intravenous Given 04/15/18 0808)  sodium chloride 0.9 % bolus 1,000 mL (0 mLs Intravenous Stopped 04/15/18 0913)  HYDROmorphone (DILAUDID) injection 0.5 mg (0.5 mg Intravenous Given 04/15/18 0913)  iohexol (OMNIPAQUE) 300 MG/ML solution 100 mL (100 mLs Intravenous Contrast Given 04/15/18 0938)     Initial Impression / Assessment and Plan / ED Course  I  have reviewed the triage vital signs and the nursing notes.  Pertinent labs & imaging results that were available during my care of the patient were reviewed by me and considered in my medical decision making (see chart for details).    Pt is feeling much better.  No evidence of intrathoracic or intraabdominal trauma on CT scans.  He is d/c home with lortab and zofran.  He is instructed to return if worse.  F/u with pcp.  Final Clinical Impressions(s) / ED Diagnoses   Final diagnoses:  Fall, initial encounter  Contusion of rib on right side, initial encounter    ED Discharge Orders         Ordered    HYDROcodone-acetaminophen (NORCO/VICODIN) 5-325 MG tablet  Every 4 hours PRN     04/15/18 1113           Jacalyn Lefevre, MD  04/15/18 1129  

## 2018-07-03 ENCOUNTER — Other Ambulatory Visit: Payer: Self-pay

## 2018-07-03 ENCOUNTER — Emergency Department (HOSPITAL_COMMUNITY): Payer: Medicaid Other

## 2018-07-03 ENCOUNTER — Emergency Department (HOSPITAL_COMMUNITY)
Admission: EM | Admit: 2018-07-03 | Discharge: 2018-07-03 | Disposition: A | Discharge status: Home / Self Care | Payer: Medicaid Other | Attending: Emergency Medicine | Admitting: Emergency Medicine

## 2018-07-03 ENCOUNTER — Encounter (HOSPITAL_COMMUNITY): Payer: Self-pay

## 2018-07-03 DIAGNOSIS — R1011 Right upper quadrant pain: Secondary | ICD-10-CM

## 2018-07-03 DIAGNOSIS — I10 Essential (primary) hypertension: Secondary | ICD-10-CM | POA: Insufficient documentation

## 2018-07-03 DIAGNOSIS — Z79899 Other long term (current) drug therapy: Secondary | ICD-10-CM | POA: Diagnosis not present

## 2018-07-03 DIAGNOSIS — F17210 Nicotine dependence, cigarettes, uncomplicated: Secondary | ICD-10-CM | POA: Insufficient documentation

## 2018-07-03 DIAGNOSIS — J45901 Unspecified asthma with (acute) exacerbation: Secondary | ICD-10-CM | POA: Diagnosis not present

## 2018-07-03 DIAGNOSIS — K8020 Calculus of gallbladder without cholecystitis without obstruction: Secondary | ICD-10-CM | POA: Diagnosis not present

## 2018-07-03 LAB — LIPASE, BLOOD: Lipase: 115 U/L — ABNORMAL HIGH (ref 11–51)

## 2018-07-03 LAB — COMPREHENSIVE METABOLIC PANEL
ALT: 53 U/L — ABNORMAL HIGH (ref 0–44)
AST: 54 U/L — ABNORMAL HIGH (ref 15–41)
Albumin: 4.1 g/dL (ref 3.5–5.0)
Alkaline Phosphatase: 64 U/L (ref 38–126)
Anion gap: 9 (ref 5–15)
BUN: 14 mg/dL (ref 6–20)
CO2: 23 mmol/L (ref 22–32)
Calcium: 9.1 mg/dL (ref 8.9–10.3)
Chloride: 107 mmol/L (ref 98–111)
Creatinine, Ser: 1.04 mg/dL (ref 0.61–1.24)
GFR calc Af Amer: >60 mL/min (ref >60)
GFR calc non Af Amer: >60 mL/min (ref >60)
Glucose, Bld: 110 mg/dL — ABNORMAL HIGH (ref 70–99)
Potassium: 3.6 mmol/L (ref 3.5–5.1)
Sodium: 139 mmol/L (ref 135–145)
Total Bilirubin: 0.8 mg/dL (ref 0.3–1.2)
Total Protein: 7.2 g/dL (ref 6.5–8.1)

## 2018-07-03 LAB — CBC WITH DIFFERENTIAL/PLATELET
Abs Immature Granulocytes: 0.05 10*3/uL (ref 0.00–0.07)
Basophils Absolute: 0.1 10*3/uL (ref 0.0–0.1)
Basophils Relative: 0 %
Eosinophils Absolute: 0.4 10*3/uL (ref 0.0–0.5)
Eosinophils Relative: 3 %
HCT: 44.3 % (ref 39.0–52.0)
Hemoglobin: 14.5 g/dL (ref 13.0–17.0)
Immature Granulocytes: 0 %
Lymphocytes Relative: 22 %
Lymphs Abs: 3.1 10*3/uL (ref 0.7–4.0)
MCH: 27.9 pg (ref 26.0–34.0)
MCHC: 32.7 g/dL (ref 30.0–36.0)
MCV: 85.4 fL (ref 80.0–100.0)
Monocytes Absolute: 0.8 10*3/uL (ref 0.1–1.0)
Monocytes Relative: 6 %
Neutro Abs: 9.9 10*3/uL — ABNORMAL HIGH (ref 1.7–7.7)
Neutrophils Relative %: 69 %
Platelets: 290 10*3/uL (ref 150–400)
RBC: 5.19 MIL/uL (ref 4.22–5.81)
RDW: 14.7 % (ref 11.5–15.5)
WBC: 14.3 10*3/uL — ABNORMAL HIGH (ref 4.0–10.5)
nRBC: 0 % (ref 0.0–0.2)

## 2018-07-03 MED ORDER — MORPHINE SULFATE (PF) 4 MG/ML IV SOLN
4.0000 mg | Freq: Once | INTRAVENOUS | Status: AC
  Administered 2018-07-03: 4 mg via INTRAVENOUS
  Filled 2018-07-03: qty 1

## 2018-07-03 MED ORDER — ALBUTEROL SULFATE HFA 108 (90 BASE) MCG/ACT IN AERS
2.0000 | INHALATION_SPRAY | RESPIRATORY_TRACT | Status: DC | PRN
  Administered 2018-07-03: 2 via RESPIRATORY_TRACT
  Filled 2018-07-03: qty 6.7

## 2018-07-03 MED ORDER — ONDANSETRON 4 MG PO TBDP: 4.0000 mg | ORAL_TABLET | Freq: Three times a day (TID) | ORAL | 0 refills | Status: DC | PRN

## 2018-07-03 MED ORDER — ONDANSETRON HCL 4 MG/2ML IJ SOLN
4.0000 mg | Freq: Once | INTRAMUSCULAR | Status: AC
  Administered 2018-07-03: 4 mg via INTRAVENOUS
  Filled 2018-07-03: qty 2

## 2018-07-03 MED ORDER — ONDANSETRON HCL 4 MG/2ML IJ SOLN
4.0000 mg | Freq: Once | INTRAMUSCULAR | Status: AC
Start: 2018-07-03 — End: 2018-07-03
  Administered 2018-07-03: 4 mg via INTRAVENOUS
  Filled 2018-07-03: qty 2

## 2018-07-03 MED ORDER — IOHEXOL 300 MG/ML  SOLN
100.0000 mL | Freq: Once | INTRAMUSCULAR | Status: AC | PRN
  Administered 2018-07-03: 14:00:00 100 mL via INTRAVENOUS

## 2018-07-03 MED ORDER — HYDROMORPHONE HCL 1 MG/ML IJ SOLN
1.0000 mg | Freq: Once | INTRAMUSCULAR | Status: AC
  Administered 2018-07-03: 1 mg via INTRAVENOUS
  Filled 2018-07-03: qty 1

## 2018-07-03 MED ORDER — IBUPROFEN 800 MG PO TABS
800.0000 mg | ORAL_TABLET | Freq: Three times a day (TID) | ORAL | 0 refills | Status: AC
Start: 2018-07-03 — End: ?

## 2018-07-03 NOTE — ED Provider Notes (Signed)
[facility] EMERGENCY DEPARTMENT Provider Note   CSN: [id number] Arrival date & time: 07/03/18  4098    History   Chief Complaint Chief Complaint  Patient presents with  . Abdominal Pain    HPI Troy Phillips is a 33 y.o. male.     The history is provided by the patient and medical records. No language interpreter was used.  Abdominal Pain  Associated symptoms: nausea and shortness of breath (Resolved after neb en route.)   Associated symptoms: no constipation, no cough, no diarrhea and no vomiting    Troy Phillips is a 33 y.o. male  with a PMH of HTN, asthma, CP who presents to the Emergency Department complaining of right-sided rib cage pain x 3 days. Patient states that he was seen in ED about 2 months ago after a fall to this area. He felt as if symptoms were improving, but 3 days ago, pain returned. He denies any new injury or inciting event. He states that the pain has been coming and going, but unable to tell me any alleviating or aggravating factors. This morning when he awoke, pain was constant and has not improved. He has not taken any medications at home. He felt like the pain triggered his asthma and he had a little difficulty breathing this morning. Given a neb treatment by FD with complete resolution in breathing symptoms. York Spaniel he is out of his inhaler at home. He also notes that he should be on blood pressure medications, however has been out and not taken them in a month. Denies any chest pain. No fevers. Denies diarrhea and constipation, but states that his stools have been looser than normal. Nausea without vomiting.   Past Medical History:  Diagnosis Date  . Asthma   . Cerebral palsy (HCC)   . Depression   . Hypertension   . Stroke [facility])    at birth    Patient Active Problem List   Diagnosis Date Noted  . Acute asthma exacerbation 10/17/2012  . Hypertension 10/17/2012  . Cerebral palsy (HCC) 04/25/2011    History reviewed. No  pertinent surgical history.      Home Medications    Prior to Admission medications   Medication Sig Start Date End Date Taking? Authorizing Provider  albuterol (PROVENTIL HFA;VENTOLIN HFA) 108 (90 Base) MCG/ACT inhaler Inhale 2 puffs into the lungs every 6 (six) hours as needed for wheezing or shortness of breath.   Yes [provider]  acetaminophen (TYLENOL) 500 MG tablet Take 2 tablets (1,000 mg total) by mouth every 6 (six) hours as needed. Patient not taking: Reported on 04/15/2018 07/10/17   Arby Barrette, MD  cyclobenzaprine (FLEXERIL) 10 MG tablet Take 1 tablet (10 mg total) by mouth 2 (two) times daily as needed for muscle spasms. Patient not taking: Reported on 04/15/2018 08/03/17   Hedges, Tinnie Gens, PA-C  diphenhydrAMINE (BENADRYL) 25 MG tablet Take 1 tablet (25 mg total) by mouth every 6 (six) hours as needed. Patient not taking: Reported on 07/10/2017 08/26/16   Garlon Hatchet, PA-C  HYDROcodone-acetaminophen (NORCO/VICODIN) 5-325 MG tablet Take 1 tablet by mouth every 4 (four) hours as needed. Patient not taking: Reported on 07/03/2018 04/15/18   Jacalyn Lefevre, MD  ibuprofen (ADVIL,MOTRIN) 800 MG tablet Take 1 tablet (800 mg total) by mouth 3 (three) times daily. 07/03/18   , Chase Picket, PA-C  ondansetron (ZOFRAN ODT) 4 MG disintegrating tablet Take 1 tablet (4 mg total) by mouth every 8 (eight) hours as  needed for nausea or vomiting. 07/03/18   , Chase Picket, PA-C  triamcinolone cream (KENALOG) 0.1 % Apply 1 application topically 2 (two) times daily. Patient not taking: Reported on 07/10/2017 08/26/16   Garlon Hatchet, PA-C    Family History No family history on file.  Social History Social History   Tobacco Use  . Smoking status: Current Every Day Smoker    Packs/day: 0.50    Types: Cigarettes  . Smokeless tobacco: Never Used  Substance Use Topics  . Alcohol use: Not Currently  . Drug use: Yes    Types: Marijuana    Comment: last used 3 days ago      Allergies   Patient has no known allergies.   Review of Systems Review of Systems  Respiratory: Positive for shortness of breath (Resolved after neb en route.). Negative for cough and wheezing.   Gastrointestinal: Positive for abdominal pain and nausea. Negative for blood in stool, constipation, diarrhea and vomiting.  Musculoskeletal: Positive for arthralgias. Negative for back pain and neck pain.  Skin: Negative for wound.  All other systems reviewed and are negative.    Physical Exam Updated Vital Signs BP (!) 151/94   Pulse (!) 56   Temp 97.6 F (36.4 C) (Oral)   Resp 20   Ht  (1.905 m)   Wt (!) 138.3 kg   SpO2 100%   BMI 38.12 kg/m   Physical Exam Vitals signs and nursing note reviewed.  Constitutional:      General: He is not in acute distress.    Appearance: He is well-developed.  HENT:     Head: Normocephalic and atraumatic.  Neck:     Musculoskeletal: Neck supple.  Cardiovascular:     Rate and Rhythm: Normal rate and regular rhythm.     Heart sounds: Normal heart sounds. No murmur.  Pulmonary:     Effort: Pulmonary effort is normal. No respiratory distress.     Breath sounds: Normal breath sounds.     Comments: Lungs clear to ausculation bilaterally. Abdominal:     General: There is no distension.     Palpations: Abdomen is soft.     Comments: Tenderness to right lateral ribcage area as well as RUQ. No rebound or guarding.  Skin:    General: Skin is warm and dry.  Neurological:     Mental Status: He is alert and oriented to person, place, and time.      ED Treatments / Results  Labs (all labs ordered are listed, but only abnormal results are displayed) Labs Reviewed  CBC WITH DIFFERENTIAL/PLATELET - Abnormal; Notable for the following components:      Result Value   WBC 14.3 (*)    Neutro Abs 9.9 (*)    All other components within normal limits  COMPREHENSIVE METABOLIC PANEL - Abnormal; Notable for the following components:   Glucose,  Bld 110 (*)    AST 54 (*)    ALT 53 (*)    All other components within normal limits  LIPASE, BLOOD - Abnormal; Notable for the following components:   Lipase 115 (*)    All other components within normal limits    EKG EKG Interpretation  Date/Time:  Saturday July 03 2018 09:29:42 EDT Ventricular Rate:  75 PR Interval:    QRS Duration: 93 QT Interval:  416 QTC Calculation: 465 R Axis:   69 Text Interpretation:  Sinus rhythm No STEMI.  Confirmed by Alona Bene [phone]) on 07/03/2018 9:57:14 AM  Radiology Dg Ribs Unilateral W/chest Right  Result Date: 07/03/2018 CLINICAL DATA:  Worsening right rib and chest pain for 2 days. Recent fall. EXAM: RIGHT RIBS AND CHEST - 3+ VIEW COMPARISON:  Chest radiograph on 07/10/2017 FINDINGS: No fracture or other bone lesions are seen involving the ribs. There is no evidence of pneumothorax or pleural effusion. Both lungs are clear. Heart size and mediastinal contours are within normal limits. IMPRESSION: Negative. Electronically Signed   By: Myles Rosenthal M.D.   On: 07/03/2018 10:40   Dg Abdomen 1 View  Result Date: 07/03/2018 CLINICAL DATA:  Right-sided abdominal pain for 2 days. EXAM: ABDOMEN - 1 VIEW COMPARISON:  None. FINDINGS: The bowel gas pattern is normal. No radio-opaque calculi or other significant radiographic abnormality are seen. Left pelvic phleboliths incidentally noted. IMPRESSION: Negative. Electronically Signed   By: Myles Rosenthal M.D.   On: 07/03/2018 10:38   Ct Abdomen Pelvis W Contrast  Result Date: 07/03/2018 CLINICAL DATA:  Right upper and lower quadrant abdominal pain. EXAM: CT ABDOMEN AND PELVIS WITH CONTRAST TECHNIQUE: Multidetector CT imaging of the abdomen and pelvis was performed using the standard protocol following bolus administration of intravenous contrast. CONTRAST:  OMNIPAQUE IOHEXOL 300 MG/ML  SOLN COMPARISON:  04/15/2018 FINDINGS: Lower Chest: No acute findings. Hepatobiliary: No hepatic masses identified.  Gallbladder is unremarkable. Pancreas:  No mass or inflammatory changes. Spleen: Within normal limits in size and appearance. Adrenals/Urinary Tract: No masses identified. No evidence of hydronephrosis. Stomach/Bowel: No evidence of obstruction, inflammatory process or abnormal fluid collections. Normal appendix visualized. Vascular/Lymphatic: No pathologically enlarged lymph nodes. No abdominal aortic aneurysm. Reproductive:  No mass or other significant abnormality. Other:  None. Musculoskeletal:  No suspicious bone lesions identified. IMPRESSION: Negative. No acute findings or other significant abnormality. Electronically Signed   By: Myles Rosenthal M.D.   On: 07/03/2018 14:49   US Abdomen Limited Ruq  Result Date: 07/03/2018 CLINICAL DATA:  Right upper quadrant pain for 3 days. EXAM: ULTRASOUND ABDOMEN LIMITED RIGHT UPPER QUADRANT COMPARISON:  None. FINDINGS: Gallbladder: Gallbladder is incompletely distended, but multiple gallstones are seen, largest measuring approximately 8 mm. No evidence of gallbladder wall thickening or pericholecystic fluid. Common bile duct: Diameter: 5 mm, within normal limits. Liver: No focal lesion identified. Within normal limits in parenchymal echogenicity. Portal vein is patent on color Doppler imaging with normal direction of blood flow towards the liver. IMPRESSION: Cholelithiasis. No definite findings of acute cholecystitis or biliary ductal dilatation. Electronically Signed   By: Myles Rosenthal M.D.   On: 07/03/2018 12:04    Procedures Procedures (including critical care time)  Medications Ordered in ED Medications  ondansetron (ZOFRAN) injection 4 mg (4 mg Intravenous Given 07/03/18 0952)  morphine 4 MG/ML injection 4 mg (4 mg Intravenous Given 07/03/18 0954)  ondansetron (ZOFRAN) injection 4 mg (4 mg Intravenous Given 07/03/18 1227)  HYDROmorphone (DILAUDID) injection 1 mg (1 mg Intravenous Given 07/03/18 1229)  iohexol (OMNIPAQUE) 300 MG/ML solution 100 mL (100 mLs  Intravenous Contrast Given 07/03/18 1419)     Initial Impression / Assessment and Plan / ED Course  I have reviewed the triage vital signs and the nursing notes.  Pertinent labs & imaging results that were available during my care of the patient were reviewed by me and considered in my medical decision making (see chart for details).       Troy Phillips is a 33 y.o. male who presents to ED for right-sided abdominal pain. Reports similar pain after fall about 2  months ago. Chart reviewed from this encounter. He did have normal CT's done at that time. Afebrile, VSS. Tenderness to right rib-cage area and RUQ. Seems more so the ribs than abdomen on initial exam. Plan for plain-films and labs.   Plain films with no acute findings. Labs reviewed and notable for leukocytosis as well as minimally elevated ast/alt 54/53 and lipase of 115. Given lab abnormalities and ruq pain, ultrasound was obtained showing gallstones without any findings concerning for acute cholecystitis. Plan for pain control / po challenge.   Patient re-evaluated. Still in significant amount of pain. Still overtly tender to right mid and upper quadrants. CT scan then performed with no acute abnormalities.   Pain was able to be controlled, then he was able to tolerate PO. Will have him follow up with gensurg to further discuss gallbladder. Discussed recommended diet and symptomatic home care instructions as well as follow up plan of care and return precautions. All questions answered.   Patient discussed with Dr. Jacqulyn Bath who agrees with treatment plan.   Final Clinical Impressions(s) / ED Diagnoses   Final diagnoses:  RUQ pain  Gallstones    ED Discharge Orders         Ordered    ondansetron (ZOFRAN ODT) 4 MG disintegrating tablet  Every 8 hours PRN,   Status:  Discontinued     07/03/18 1500    ibuprofen (ADVIL,MOTRIN) 800 MG tablet  3 times daily     07/03/18 1504    ondansetron (ZOFRAN ODT) 4 MG disintegrating tablet   Every 8 hours PRN     07/03/18 1505           , Chase Picket, PA-C 07/04/18 0911    Maia Plan, MD 07/04/18 1230

## 2018-07-03 NOTE — ED Notes (Signed)
Patient verbalizes understanding of discharge instructions. Opportunity for questioning and answers were provided. Pt discharged from ED. 

## 2018-07-03 NOTE — ED Notes (Signed)
Pt's o2 sats noted to be in the mid 80's while sleeping; pt placed on 2L via [place], sats now 99%

## 2018-07-03 NOTE — ED Notes (Signed)
Patient transported to Ultrasound 

## 2018-07-03 NOTE — Discharge Instructions (Addendum)
Zofran as needed for nausea.   Alternate between Tylenol and Ibuprofen as needed for pain.   Call the surgery clinic to schedule a follow up appointment to further discuss your abdominal pain.   It is VERY important that you monitor your symptoms and return to the Emergency Department if you develop any of the following symptoms:  You have a fever.  You keep throwing up and can't keep fluids down. You pass bloody or black tarry stools.  There is bright red blood in the stool. You do not seem to be getting better.  You have any questions or concerns.

## 2018-07-03 NOTE — ED Triage Notes (Addendum)
Opt c./o R sided abdominal pain that began last night, non productive cough x 3 days; seen for fall 5 weeks ago, was told he had internal bleeding RLQ, was sent home; woke up w/ mild sob, was given albuterol neb by FD; pt has been out of inhaler for several days and out of bp meds x 1 month; pt has hx cerebral palsy, htn; endorses dysuria and decreased bowel activity from norm; abd tender to palpation; endorses 1 episode of vomiting last night  200/110 R 78 Lungs clear w/ ems  T 97.3 3L  99%

## 2018-07-03 NOTE — ED Notes (Signed)
Pt back from Ultrasound

## 2018-07-23 ENCOUNTER — Telehealth: Payer: Self-pay | Admitting: Cardiology

## 2018-07-23 NOTE — Telephone Encounter (Signed)
Mychart, smartphone, pre reg complete 07/23/18 AF °

## 2018-07-25 NOTE — Progress Notes (Signed)
Error

## 2018-07-26 ENCOUNTER — Telehealth: Payer: Medicaid Other | Admitting: Cardiology

## 2018-07-26 ENCOUNTER — Encounter: Payer: Self-pay | Admitting: Cardiology

## 2018-08-09 ENCOUNTER — Telehealth: Payer: Self-pay | Admitting: Neurology

## 2018-08-09 NOTE — Telephone Encounter (Signed)
Due to current COVID 19 pandemic, our office is severely reducing in office visits, in order to minimize the risk to our patients and healthcare providers.    Pt understands that although there may be some limitations with this type of visit, we will take all precautions to reduce any security or privacy concerns.  Pt understands that this will be treated like an in office visit and we will file with pt's insurance, and there may be a patient responsible charge related to this service.  Pt's email is [email or web address]. Pt will be using [employer] for their virtual visit. Pt understands that the nurse will be calling to go over pt's chart.

## 2018-08-13 ENCOUNTER — Ambulatory Visit: Payer: Self-pay | Admitting: General Surgery

## 2018-08-18 NOTE — Telephone Encounter (Signed)
I called pt to update his chart. No answer, left a message asking him back.

## 2018-08-19 ENCOUNTER — Ambulatory Visit (INDEPENDENT_AMBULATORY_CARE_PROVIDER_SITE_OTHER): Payer: Medicaid Other | Admitting: Neurology

## 2018-08-19 ENCOUNTER — Encounter: Payer: Self-pay | Admitting: Neurology

## 2018-08-19 ENCOUNTER — Other Ambulatory Visit: Payer: Self-pay

## 2018-08-19 DIAGNOSIS — E669 Obesity, unspecified: Secondary | ICD-10-CM | POA: Diagnosis not present

## 2018-08-19 DIAGNOSIS — G4733 Obstructive sleep apnea (adult) (pediatric): Secondary | ICD-10-CM | POA: Diagnosis not present

## 2018-08-19 DIAGNOSIS — G4719 Other hypersomnia: Secondary | ICD-10-CM | POA: Diagnosis not present

## 2018-08-19 NOTE — Patient Instructions (Signed)
Given verbally, during today's virtual video-based encounter, with verbal feedback received.   

## 2018-08-19 NOTE — Telephone Encounter (Signed)
I called pt again to update his chart. No answer, left a message asking him to call me back. 

## 2018-08-19 NOTE — Progress Notes (Signed)
[name], MD, PhD [facility] [address] [address] [place], [place] [address]   Virtual Visit via Video Note on 08/19/2018;  I connected with Troy Phillips on 08/19/18 at 11:00 AM EDT by a video enabled telemedicine application and verified that I am speaking with the correct person using two identifiers.   I discussed the limitations of evaluation and management by telemedicine and the availability of in person appointments. The patient expressed understanding and agreed to proceed.  History of Present Illness: Troy Phillips is a 33 year old right-handed gentleman with an underlying medical history of hypertension, asthma, cerebral palsy, depression, history of stroke and obesity, who presents for a virtual, video based appointment via [email or web address] for evaluation of his sleep disorder, in particular, concern for underlying obstructive sleep apnea.  The patient is accompanied by his wife today and appears to lay in bed and joins via cell phone. I am located in my office.  He is referred by Dr. Feliciana Rossetti, and I reviewed his office records from 07/07/2018.  The patient has been diagnosed with chronic cholecystitis and is being scheduled for lap chole.  His wife reports that he is scheduled for mid-June.  She also adds that he will need a cardiac stent.  I do not see any cardiology notes in his chart.  She is not able to tell me who he has seen for cardiology. His Epworth sleepiness score is 11 out of 24, fatigue severity score is 33 out of 63. 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  This was in 2018, he describes going to the hospital for his sleep study.  He was given a CPAP machine and used it via full facemask.  He endorses sleeping better and having more energy after using CPAP.  He would be willing to get retested and have a CPAP machine.  He is not aware of any family history of OSA.  He has not had a tonsillectomy.  His weight  has been plus minus the same with 5 pound fluctuations typically.  He does drink quite a bit of coffee and typically cold coffee and tea, altogether about 5 or 6 servings per day.  His wife noticed repeated breathing pauses while he is asleep.  She has did not him to have them breathe again.  He smokes about 1/2 pack/day.  Bedtime is between 1130 and midnight and he has to get up at 6 to get his 17-year-old ready.  He has altogether 5 kids and she has altogether 4 children, they are currently 6 people in the household.  They have 1 dog in the household, no TV in the bedroom.  He denies night to night nocturia, has occasional morning headaches.  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  His Family History Is Significant For: Family History  Problem Relation Age of Onset   Heart attack Father    Heart disease Maternal Grandmother    Hypertension Other     His Social History Is Significant For: Social History   Socioeconomic History   Marital status: Single    Spouse name: Not on file   Number of children: Not on file  Years of education: Not on file   Highest education level: Not on file  Occupational History   Not on file  Social Needs   Financial resource strain: Not on file   Food insecurity:    Worry: Not on file    Inability: Not on file   Transportation needs:    Medical: Not on file    Non-medical: Not on file  Tobacco Use   Smoking status: Current Every Day Smoker    Packs/day: 0.50    Types: Cigarettes   Smokeless tobacco: Never Used  Substance and Sexual Activity   Alcohol use: Not Currently   Drug use: Yes    Types: Marijuana    Comment: last used 3 days ago   Sexual activity: Not on file  Lifestyle   Physical activity:     Days per week: Not on file    Minutes per session: Not on file   Stress: Not on file  Relationships   Social connections:    Talks on phone: Not on file    Gets together: Not on file    Attends religious service: Not on file    Active member of club or organization: Not on file    Attends meetings of clubs or organizations: Not on file    Relationship status: Not on file  Other Topics Concern   Not on file  Social History Narrative   Not on file    His Allergies Are:  No Known Allergies:   His Current Medications Are:  Outpatient Encounter Medications as of 08/19/2018  Medication Sig   albuterol (PROVENTIL HFA;VENTOLIN HFA) 108 (90 Base) MCG/ACT inhaler Inhale 2 puffs into the lungs every 6 (six) hours as needed for wheezing or shortness of breath.   cyclobenzaprine (FLEXERIL) 10 MG tablet Take 1 tablet (10 mg total) by mouth 2 (two) times daily as needed for muscle spasms.   diphenhydrAMINE (BENADRYL) 25 MG tablet Take 1 tablet (25 mg total) by mouth every 6 (six) hours as needed.   HYDROcodone-acetaminophen (NORCO/VICODIN) 5-325 MG tablet Take 1 tablet by mouth every 4 (four) hours as needed.   ibuprofen (ADVIL,MOTRIN) 800 MG tablet Take 1 tablet (800 mg total) by mouth 3 (three) times daily.   ondansetron (ZOFRAN ODT) 4 MG disintegrating tablet Take 1 tablet (4 mg total) by mouth every 8 (eight) hours as needed for nausea or vomiting.   No facility-administered encounter medications on file as of 08/19/2018.   :   Review of Systems:  Out of a complete 14 point review of systems, all are reviewed and negative with the exception of these symptoms as listed below:  Observations/Objective: The most recent vital signs available for my review today are from 07/07/2018: Blood pressure 164/92, pulse 96, pulse ox on room air 98%, temperature 98.1, weight 299.4 pounds for BMI of 38.4. On examination, he is in no acute distress, face is symmetric, speech not dysarthric.    Airway examination reveals moderate airway crowding, tonsils in place, tongue protrudes centrally in palate elevates symmetrically, Mallampati class II.  Tongue on the larger side.  Neck circumference appears on the larger side but an neck measure is not available. He is able to sit up for the appointment.  Upper body muscle bulk appears unremarkable, upper extremity movements grossly intact, coordination grossly intact.  Assessment and Plan: Mr. Troy Phillips is a 33 year old right-handed gentleman with an underlying medical history of hypertension, asthma, cerebral palsy, depression, history of stroke and obesity, with whom I  am conducting a virtual, video based new patient visit via [email or web address] in lieu of a face-to-face visit for evaluation of an underlying organic sleep disorder, in particular, concern for obstructive sleep apnea. The patient's medical history and physical exam (albeit limited with current video-based evaluation) are concerning for a diagnosis of obstructive sleep apnea. I discussed with the patient the diagnosis of OSA, its prognosis and treatment options. I explained in particular the risks and ramifications of untreated moderate to severe OSA, especially with respect to developing cardiovascular disease down the Road, including congestive heart failure, difficult to treat hypertension, cardiac arrhythmias, or stroke. Even type 2 diabetes has, in part, been linked to untreated OSA. Symptoms of untreated OSA may include daytime sleepiness, memory problems, mood irritability and mood disorder such as depression and anxiety, lack of energy, as well as recurrent headaches, especially morning headaches. We talked about smoking cessation and the importance of weight control. We talked about the importance of maintaining good sleep hygiene. I recommended the following at this time: sleep study.   I explained the sleep test procedure to the patient and also outlined possible treatment options of  OSA, including the use of a custom-made dental device (which would require a referral to a specialist dentist), upper airway surgical options, (such as UPPP, which would involve a referral to an ENT). I also explained the CPAP vs. AutoPAP treatment option to the patient, who indicated that he would be willing to try CPAP if the need arises. I answered all their questions today and the patient and his wife were in agreement. I plan to see the patient back after the sleep study is completed and encouraged him to call with any interim questions, concerns, problems or updates.   [name], MD, PhD    Follow Up Instructions:    I discussed the assessment and treatment plan with the patient. The patient was provided an opportunity to ask questions and all were answered. The patient agreed with the plan and demonstrated an understanding of the instructions.   The patient was advised to call back or seek an in-person evaluation if the symptoms worsen or if the condition fails to improve as anticipated.  I provided 30 minutes of non-face-to-face time during this encounter.   [name], MD

## 2018-08-19 NOTE — Telephone Encounter (Signed)
Pt returned my call. Pt's meds, allergies, and PMH were updated.  Pt had a sleep study in prison but never received the results. Pt does endorse snoring.  Pt reports that his weight is 295 lbs and he is 6'3.  Pt was instructed on how to measure his neck size.  Epworth Sleepiness Scale 0= would never doze 1= slight chance of dozing 2= moderate chance of dozing 3= high chance of dozing  Sitting and reading: 1 Watching TV: 3 Sitting inactive in a public place (ex. Theater or meeting): 1 As a passenger in a car for an hour without a break: 1 Lying down to rest in the afternoon: 3 Sitting and talking to someone: 0 Sitting quietly after lunch (no alcohol): 2 In a car, while stopped in traffic: 0 Total: 11  FSS: 33

## 2018-09-13 ENCOUNTER — Telehealth: Payer: Self-pay

## 2018-09-13 NOTE — Telephone Encounter (Signed)
We have attempted to call the patient two times to schedule sleep study.  Patient has been unavailable at the phone numbers we have on file and has not returned our calls.  We have left detailed messages asking for a return call back.  If patient calls back we will schedule them for their sleep study.

## 2018-10-10 ENCOUNTER — Encounter (HOSPITAL_COMMUNITY): Payer: Self-pay | Admitting: *Deleted

## 2018-10-10 ENCOUNTER — Emergency Department (HOSPITAL_COMMUNITY): Payer: Medicaid Other

## 2018-10-10 ENCOUNTER — Other Ambulatory Visit: Payer: Self-pay

## 2018-10-10 ENCOUNTER — Emergency Department (HOSPITAL_COMMUNITY)
Admission: EM | Admit: 2018-10-10 | Discharge: 2018-10-10 | Discharge status: Against Medical Advice | Payer: Medicaid Other | Attending: Emergency Medicine | Admitting: Emergency Medicine

## 2018-10-10 DIAGNOSIS — R0789 Other chest pain: Secondary | ICD-10-CM | POA: Diagnosis present

## 2018-10-10 DIAGNOSIS — Z5321 Procedure and treatment not carried out due to patient leaving prior to being seen by health care provider: Secondary | ICD-10-CM | POA: Diagnosis not present

## 2018-10-10 LAB — CBC
HCT: 47.2 % (ref 39.0–52.0)
Hemoglobin: 15.5 g/dL (ref 13.0–17.0)
MCH: 28.1 pg (ref 26.0–34.0)
MCHC: 32.8 g/dL (ref 30.0–36.0)
MCV: 85.5 fL (ref 80.0–100.0)
Platelets: 263 10*3/uL (ref 150–400)
RBC: 5.52 MIL/uL (ref 4.22–5.81)
RDW: 14.4 % (ref 11.5–15.5)
WBC: 12.8 10*3/uL — ABNORMAL HIGH (ref 4.0–10.5)
nRBC: 0 % (ref 0.0–0.2)

## 2018-10-10 LAB — BASIC METABOLIC PANEL
Anion gap: 11 (ref 5–15)
BUN: 14 mg/dL (ref 6–20)
CO2: 21 mmol/L — ABNORMAL LOW (ref 22–32)
Calcium: 9.4 mg/dL (ref 8.9–10.3)
Chloride: 108 mmol/L (ref 98–111)
Creatinine, Ser: 1.19 mg/dL (ref 0.61–1.24)
GFR calc Af Amer: >60 mL/min (ref >60)
GFR calc non Af Amer: >60 mL/min (ref >60)
Glucose, Bld: 94 mg/dL (ref 70–99)
Potassium: 3.2 mmol/L — ABNORMAL LOW (ref 3.5–5.1)
Sodium: 140 mmol/L (ref 135–145)

## 2018-10-10 LAB — TROPONIN I (HIGH SENSITIVITY): Troponin I (High Sensitivity): 40 ng/L — ABNORMAL HIGH (ref <18)

## 2018-10-10 MED ORDER — SODIUM CHLORIDE 0.9% FLUSH: 3.0000 mL | Freq: Once | INTRAVENOUS | Status: DC

## 2018-10-10 NOTE — ED Notes (Signed)
Pt came up to sort desk stating he couldn't wait any longer due to having to get home to his kids. This RN encouraged the pt to stay but pt refused
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
## 2020-02-20 ENCOUNTER — Other Ambulatory Visit: Payer: Self-pay

## 2020-02-20 ENCOUNTER — Emergency Department (HOSPITAL_BASED_OUTPATIENT_CLINIC_OR_DEPARTMENT_OTHER)
Admit: 2020-02-20 | Discharge: 2020-02-20 | Disposition: A | Discharge status: Home / Self Care | Payer: Medicaid Other | Attending: Emergency Medicine | Admitting: Emergency Medicine

## 2020-02-20 ENCOUNTER — Encounter (HOSPITAL_COMMUNITY): Payer: Self-pay

## 2020-02-20 ENCOUNTER — Emergency Department (HOSPITAL_COMMUNITY)
Admission: EM | Admit: 2020-02-20 | Discharge: 2020-02-20 | Discharge status: Against Medical Advice | Payer: Medicaid Other | Attending: Emergency Medicine | Admitting: Emergency Medicine

## 2020-02-20 DIAGNOSIS — R609 Edema, unspecified: Secondary | ICD-10-CM | POA: Diagnosis not present

## 2020-02-20 DIAGNOSIS — R2243 Localized swelling, mass and lump, lower limb, bilateral: Secondary | ICD-10-CM | POA: Insufficient documentation

## 2020-02-20 DIAGNOSIS — I10 Essential (primary) hypertension: Secondary | ICD-10-CM | POA: Diagnosis not present

## 2020-02-20 DIAGNOSIS — J45909 Unspecified asthma, uncomplicated: Secondary | ICD-10-CM | POA: Insufficient documentation

## 2020-02-20 DIAGNOSIS — M7989 Other specified soft tissue disorders: Secondary | ICD-10-CM

## 2020-02-20 DIAGNOSIS — K625 Hemorrhage of anus and rectum: Secondary | ICD-10-CM | POA: Insufficient documentation

## 2020-02-20 DIAGNOSIS — R748 Abnormal levels of other serum enzymes: Secondary | ICD-10-CM | POA: Insufficient documentation

## 2020-02-20 DIAGNOSIS — F17210 Nicotine dependence, cigarettes, uncomplicated: Secondary | ICD-10-CM | POA: Insufficient documentation

## 2020-02-20 DIAGNOSIS — R197 Diarrhea, unspecified: Secondary | ICD-10-CM | POA: Diagnosis present

## 2020-02-20 LAB — COMPREHENSIVE METABOLIC PANEL
ALT: 44 U/L (ref 0–44)
AST: 47 U/L — ABNORMAL HIGH (ref 15–41)
Albumin: 4.3 g/dL (ref 3.5–5.0)
Alkaline Phosphatase: 53 U/L (ref 38–126)
Anion gap: 8 (ref 5–15)
BUN: 10 mg/dL (ref 6–20)
CO2: 26 mmol/L (ref 22–32)
Calcium: 8.7 mg/dL — ABNORMAL LOW (ref 8.9–10.3)
Chloride: 107 mmol/L (ref 98–111)
Creatinine, Ser: 0.89 mg/dL (ref 0.61–1.24)
GFR, Estimated: >60 mL/min (ref >60)
Glucose, Bld: 126 mg/dL — ABNORMAL HIGH (ref 70–99)
Potassium: 3.7 mmol/L (ref 3.5–5.1)
Sodium: 141 mmol/L (ref 135–145)
Total Bilirubin: 1.1 mg/dL (ref 0.3–1.2)
Total Protein: 7.5 g/dL (ref 6.5–8.1)

## 2020-02-20 LAB — CBC
HCT: 46.1 % (ref 39.0–52.0)
Hemoglobin: 15 g/dL (ref 13.0–17.0)
MCH: 28 pg (ref 26.0–34.0)
MCHC: 32.5 g/dL (ref 30.0–36.0)
MCV: 86 fL (ref 80.0–100.0)
Platelets: 254 10*3/uL (ref 150–400)
RBC: 5.36 MIL/uL (ref 4.22–5.81)
RDW: 14.9 % (ref 11.5–15.5)
WBC: 8.1 10*3/uL (ref 4.0–10.5)
nRBC: 0 % (ref 0.0–0.2)

## 2020-02-20 LAB — CK: Total CK: 2229 U/L — ABNORMAL HIGH (ref 49–397)
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
## 2020-02-20 NOTE — ED Notes (Signed)
Pt refusing EKG, states " he is ready to go". RN notified.

## 2020-02-20 NOTE — ED Notes (Signed)
Pt becoming a little irrate, wanting his IV out and saying "I am ready to go". Explained that not all results are back yet, but he still insists on leaving. Provider made aware. Pt leaving AMA, refused to sign. Risks explained up to and including death.

## 2020-02-20 NOTE — Progress Notes (Signed)
Left lower extremity venous duplex has been completed. Preliminary results can be found in CV Proc through chart review.  Results were given to Lyndel Safe PA.  02/20/20 3:51 PM Olen Cordial RVT

## 2020-02-23 LAB — TYPE AND SCREEN
ABO/RH(D): O POS
Antibody Screen: NEGATIVE

## 2020-12-19 IMAGING — US ULTRASOUND ABDOMEN LIMITED
1 series · 14 of 25 positions shown · non-contrast
Comparison: None.

CLINICAL DATA: Right upper quadrant pain for 3 days.

EXAM:
ULTRASOUND ABDOMEN LIMITED RIGHT UPPER QUADRANT

[Series 1: ultrasound abdomen limited · 14 of 46 slices shown]
[im 1/46]
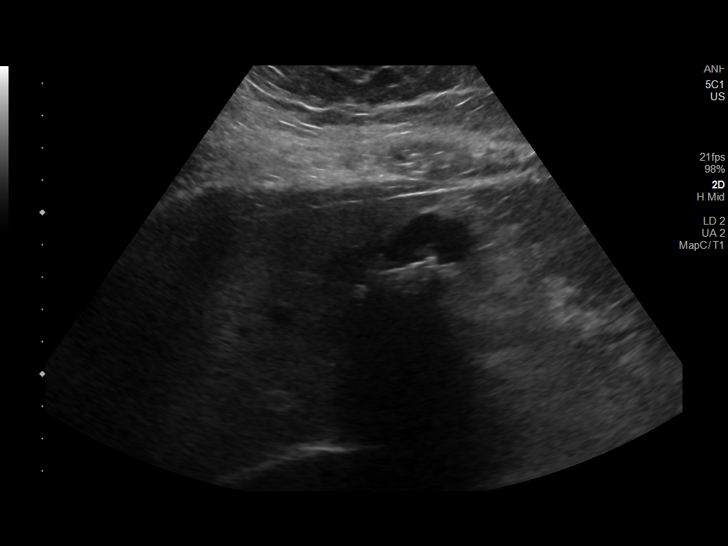
[im 4/46]
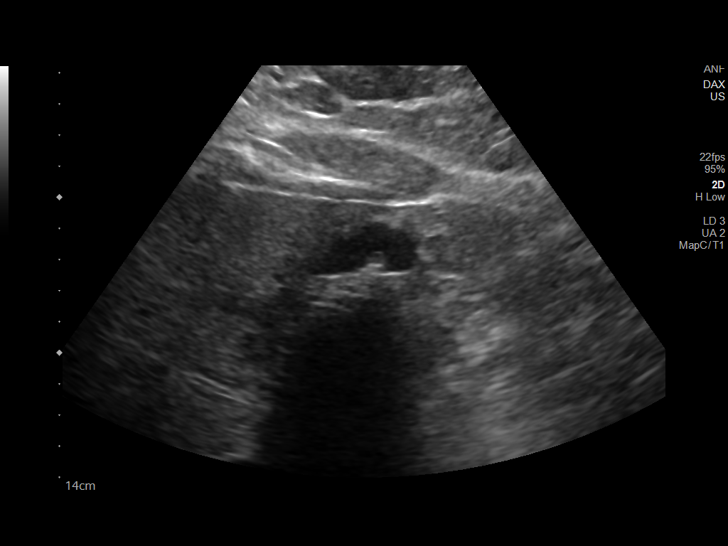
[im 8/46]
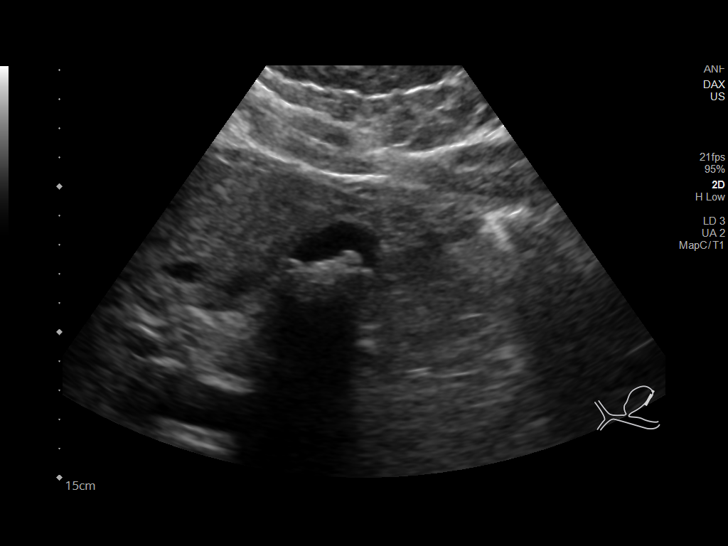
[im 12/46]
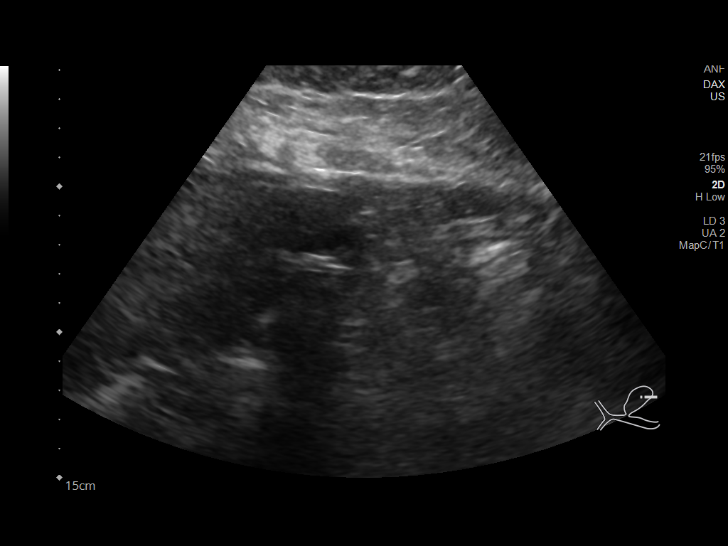
[im 16/46]
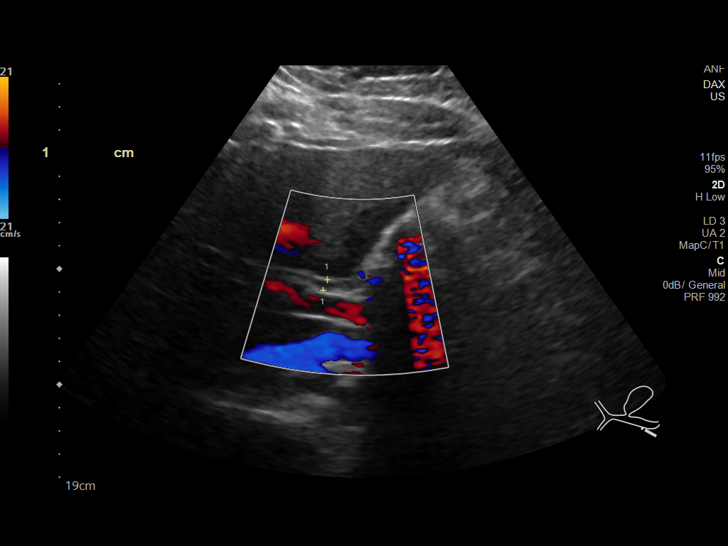
[im 17/46]
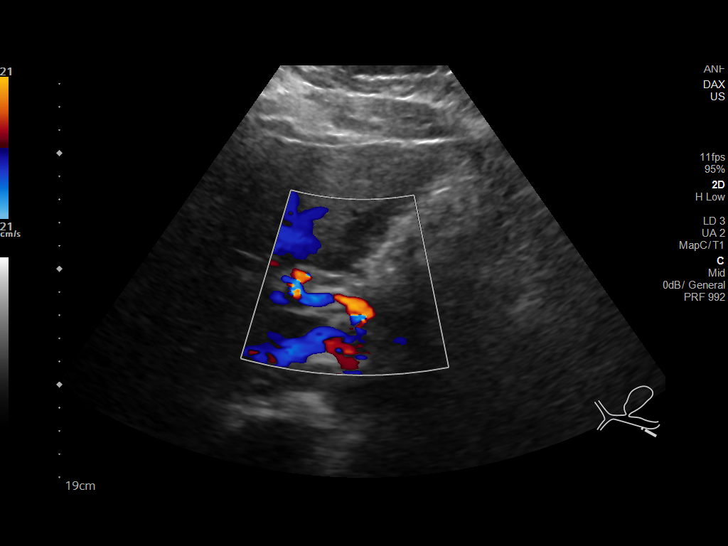
[im 21/46]
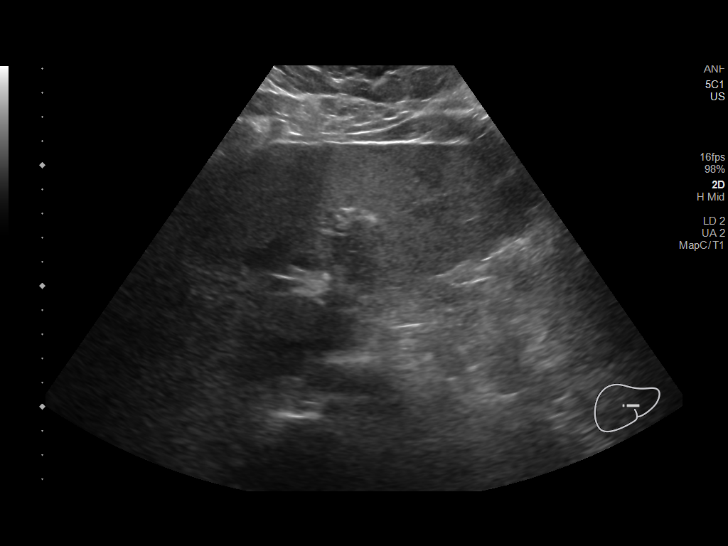
[im 25/46]
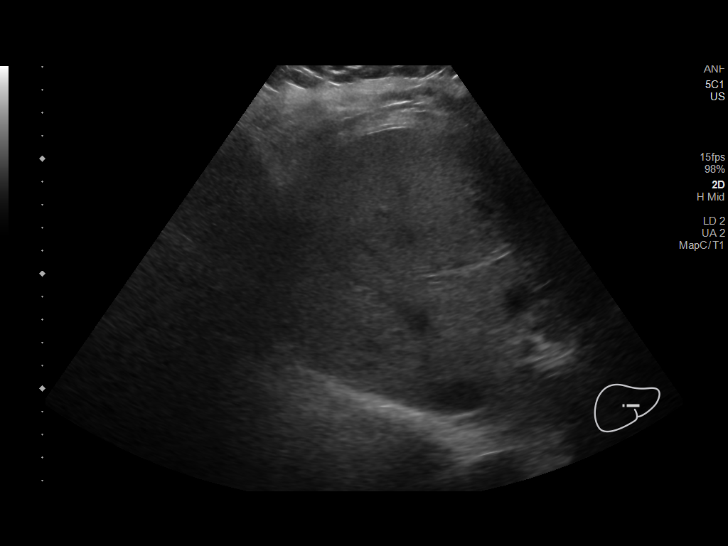
[im 29/46]
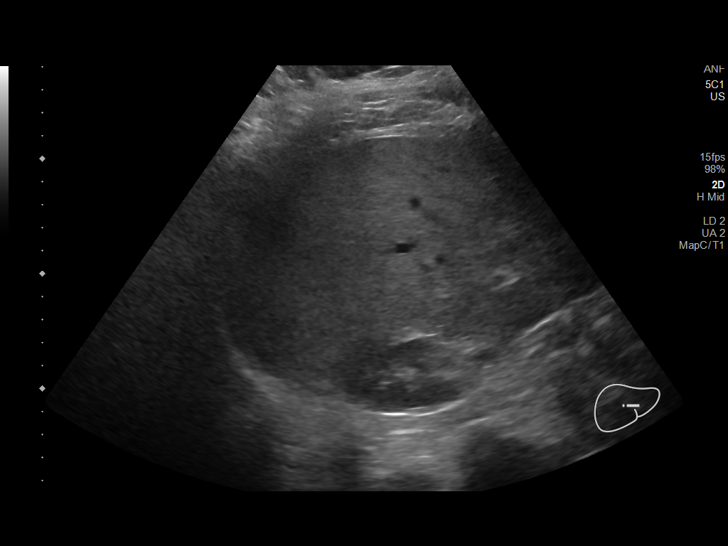
[im 31/46]
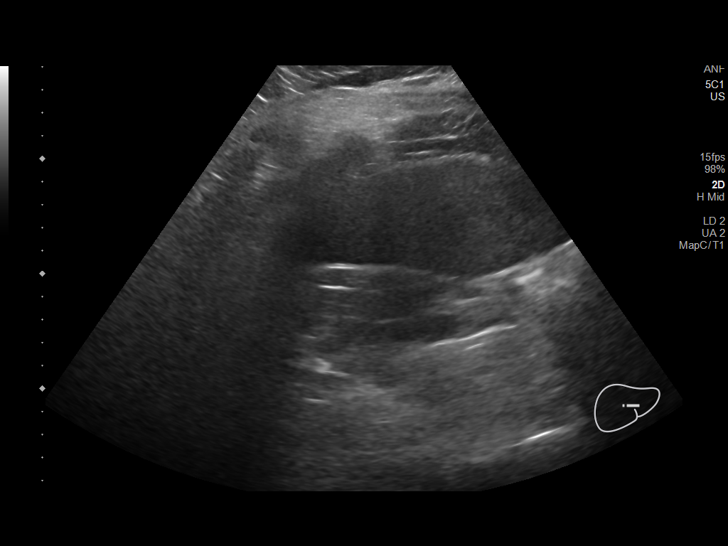
[im 34/46]
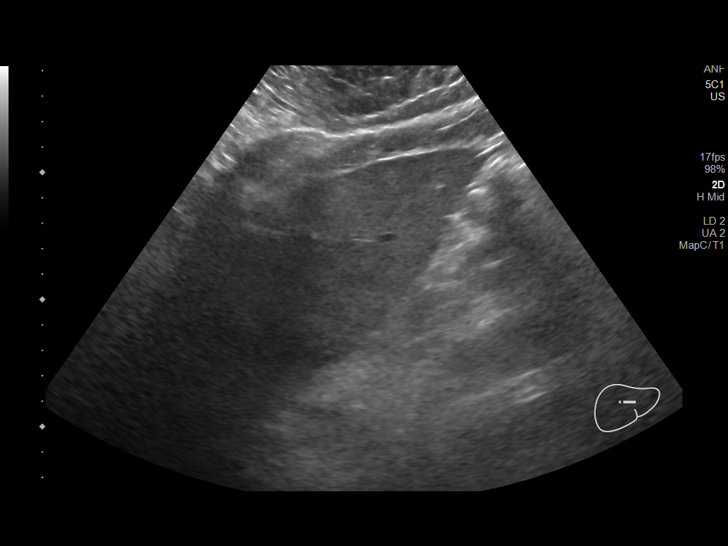
[im 38/46]
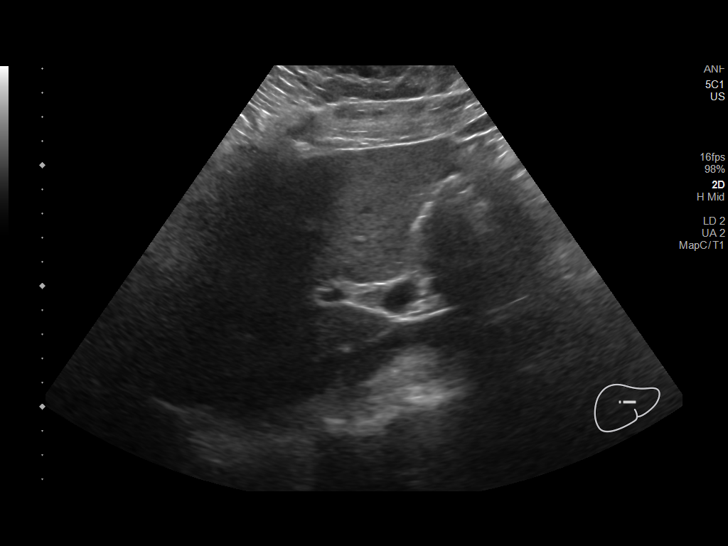
[im 42/46]
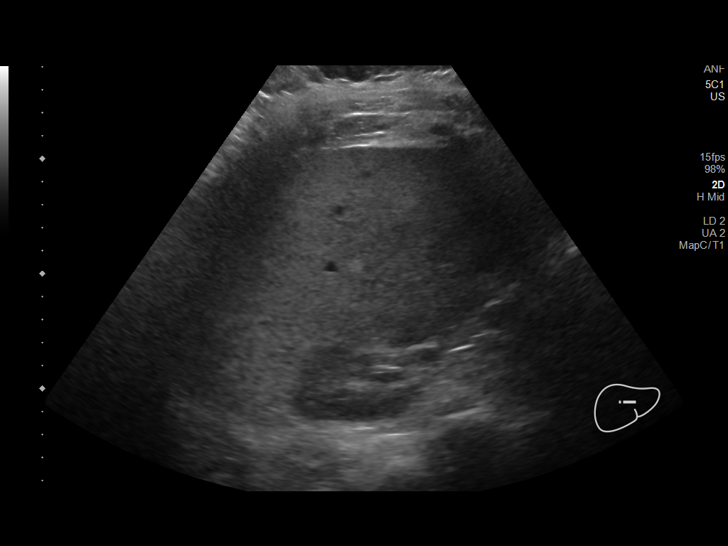
[im 46/46]
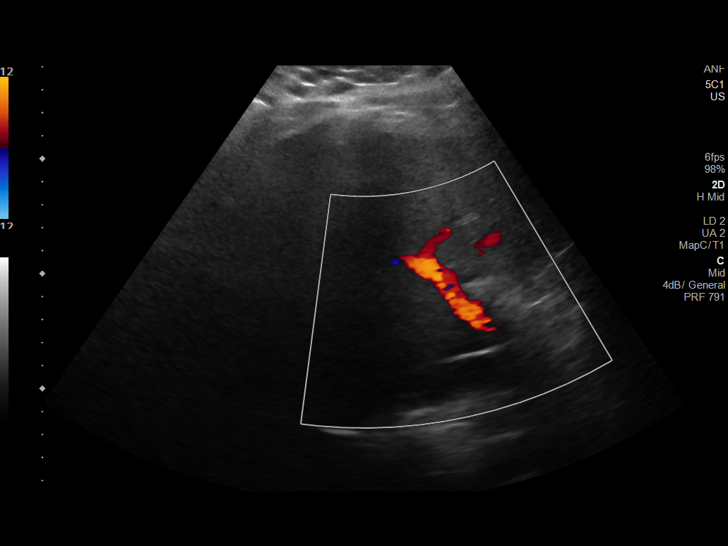

[14 of 25 positions shown; findings below may reference images not displayed]

FINDINGS: Gallbladder:

Gallbladder is incompletely distended, but multiple gallstones are
seen, largest measuring approximately 8 mm. No evidence of
gallbladder wall thickening or pericholecystic fluid.

Common bile duct:

Diameter: 5 mm, within normal limits.

Liver:

No focal lesion identified. Within normal limits in parenchymal
echogenicity. Portal vein is patent on color Doppler imaging with
normal direction of blood flow towards the liver.
IMPRESSION: Cholelithiasis. No definite findings of acute cholecystitis or
biliary ductal dilatation.

## 2021-03-28 IMAGING — DX CHEST - 2 VIEW
2 series · 2 of 2 positions shown · non-contrast
Comparison: 07/03/2018

CLINICAL DATA: Chest pain

EXAM:
CHEST - 2 VIEW

[chest pa]
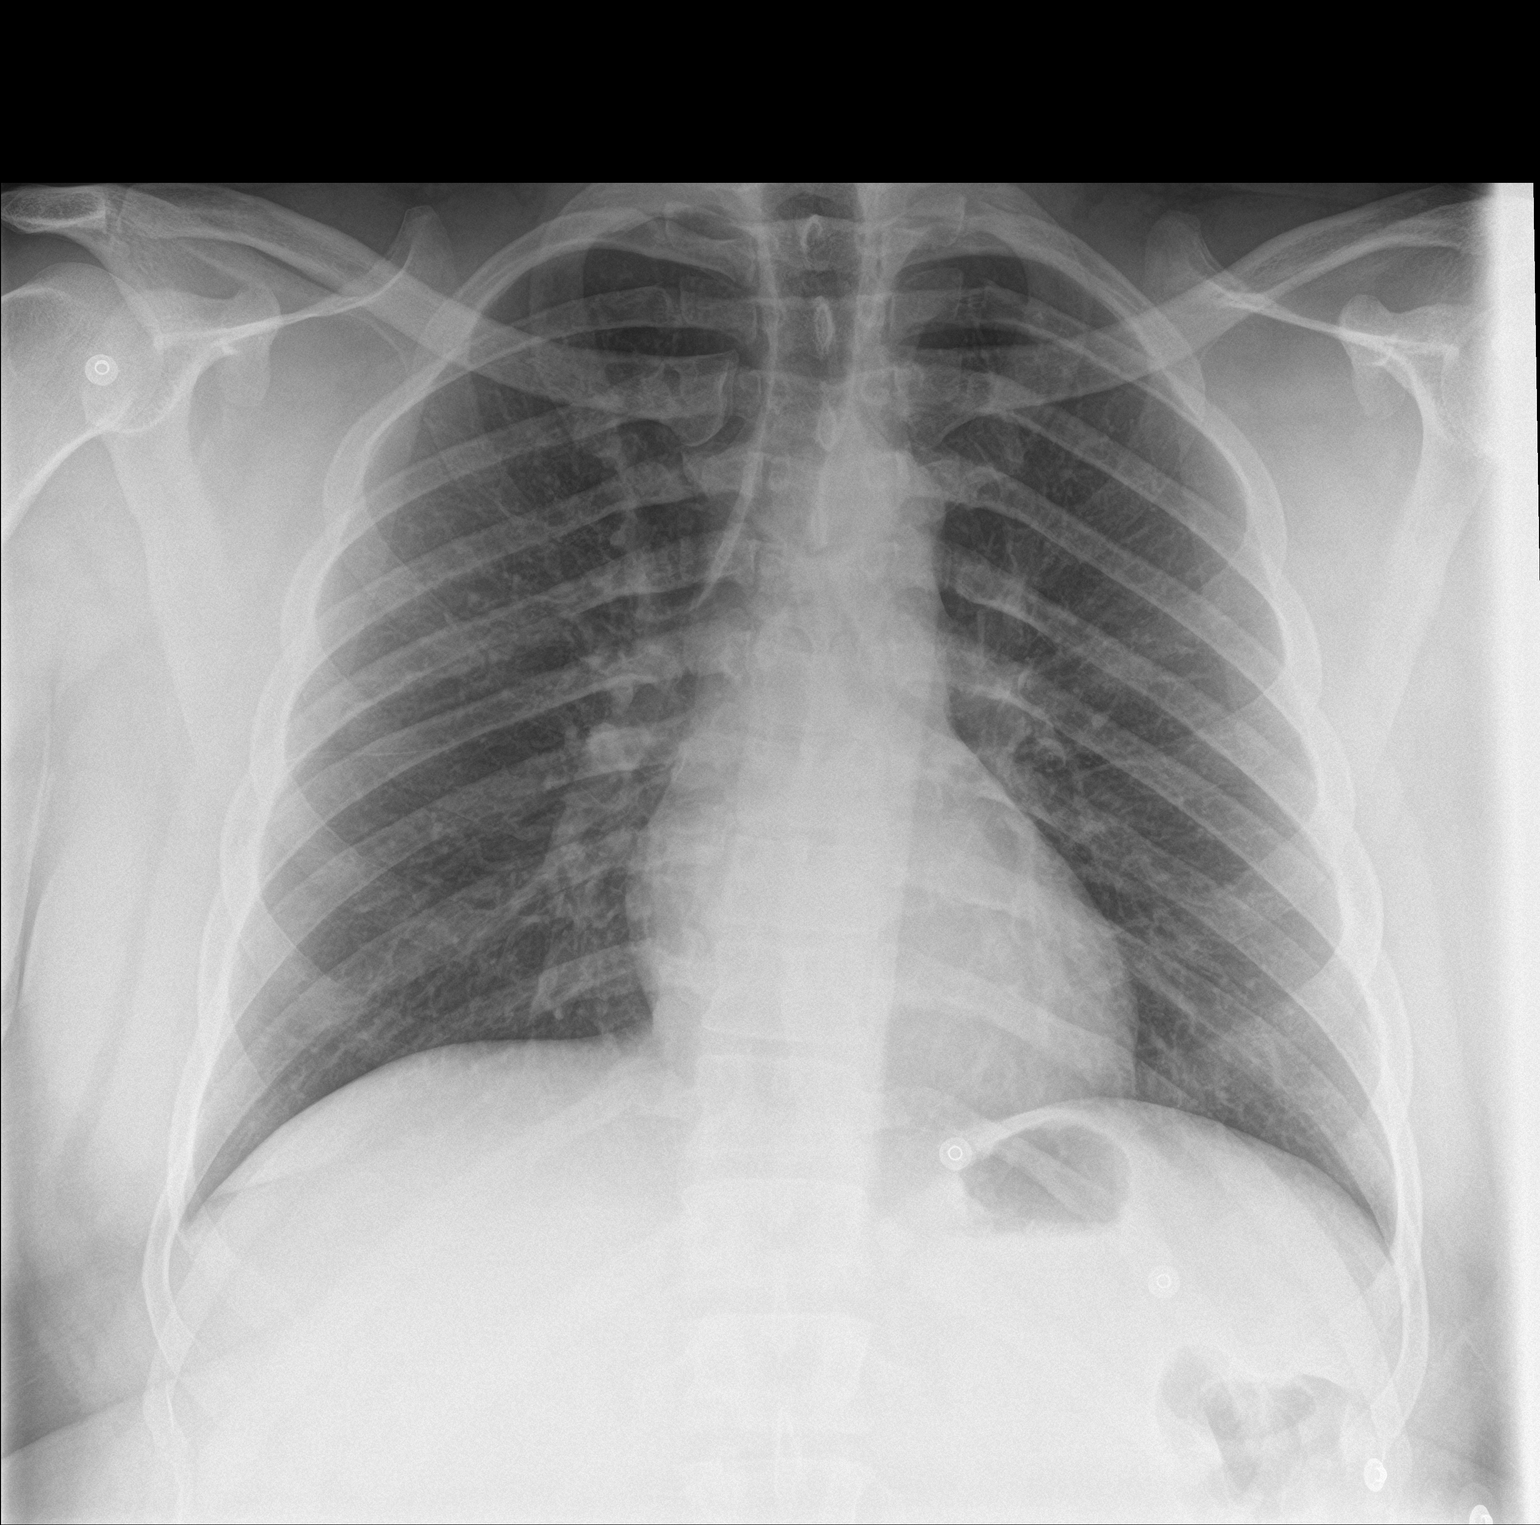

[chest lat]
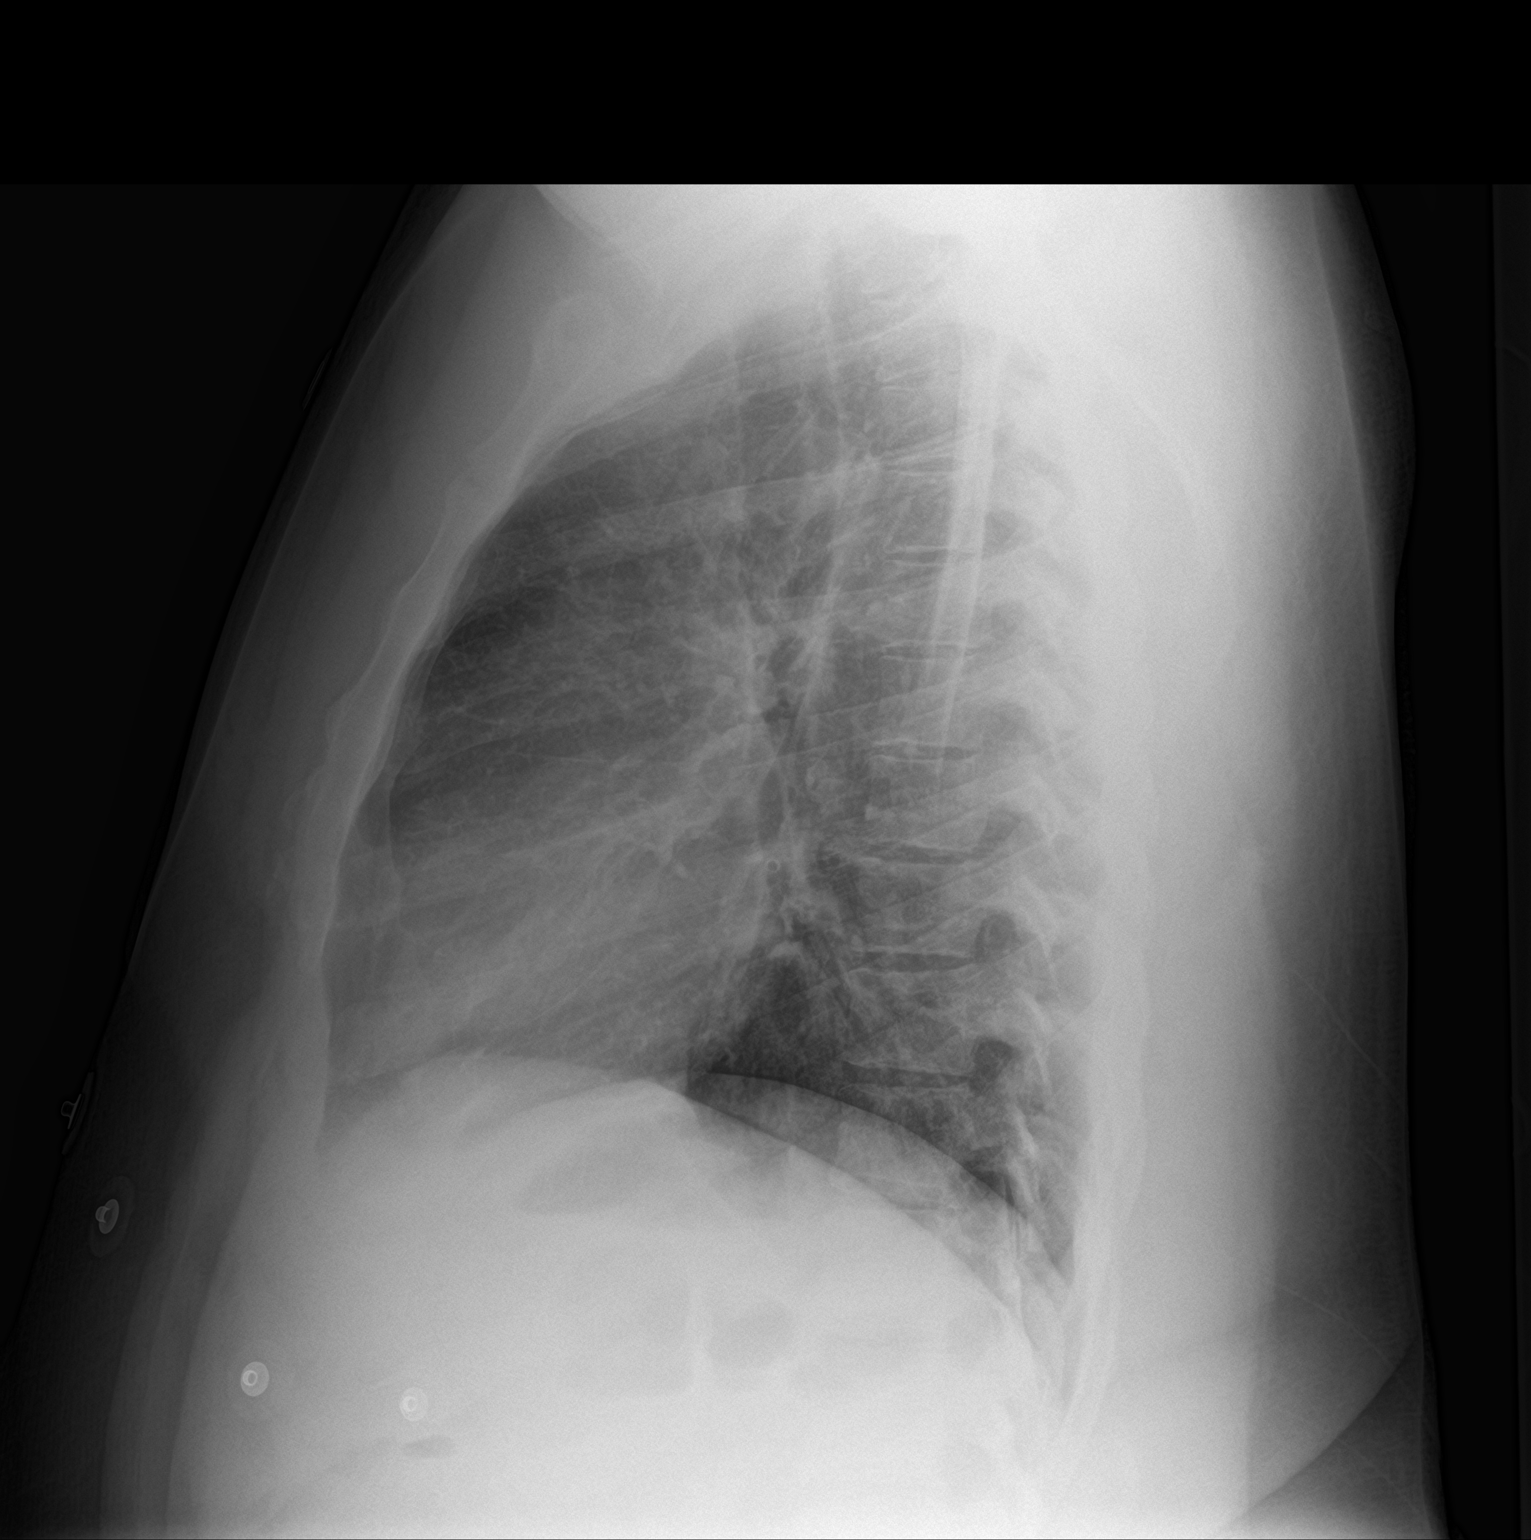

[2 of 2 positions shown; findings below may reference images not displayed]

FINDINGS: The heart size and mediastinal contours are within normal limits.
Both lungs are clear. The visualized skeletal structures are
unremarkable.
IMPRESSION: No active cardiopulmonary disease.

## 2021-08-30 ENCOUNTER — Encounter (HOSPITAL_COMMUNITY): Payer: Self-pay | Admitting: Licensed Clinical Social Worker

## 2021-08-30 ENCOUNTER — Ambulatory Visit (INDEPENDENT_AMBULATORY_CARE_PROVIDER_SITE_OTHER): Payer: Medicaid Other | Admitting: Licensed Clinical Social Worker

## 2021-08-30 ENCOUNTER — Encounter (HOSPITAL_COMMUNITY): Payer: Self-pay

## 2021-08-30 DIAGNOSIS — F331 Major depressive disorder, recurrent, moderate: Secondary | ICD-10-CM | POA: Insufficient documentation

## 2021-08-30 DIAGNOSIS — F6381 Intermittent explosive disorder: Secondary | ICD-10-CM | POA: Diagnosis not present

## 2021-08-30 DIAGNOSIS — F411 Generalized anxiety disorder: Secondary | ICD-10-CM
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
## 2021-08-30 NOTE — Plan of Care (Signed)
Pt agreeable to plan  ?

## 2021-09-02 ENCOUNTER — Telehealth (HOSPITAL_COMMUNITY): Payer: Self-pay | Admitting: Professional

## 2021-09-02 ENCOUNTER — Ambulatory Visit (HOSPITAL_COMMUNITY): Payer: Self-pay

## 2021-09-05 ENCOUNTER — Telehealth (HOSPITAL_COMMUNITY): Payer: Self-pay | Admitting: Licensed Clinical Social Worker

## 2021-09-05 ENCOUNTER — Encounter (HOSPITAL_COMMUNITY): Payer: Self-pay

## 2021-09-05 ENCOUNTER — Ambulatory Visit (HOSPITAL_COMMUNITY): Payer: Self-pay

## 2021-09-06 ENCOUNTER — Ambulatory Visit (INDEPENDENT_AMBULATORY_CARE_PROVIDER_SITE_OTHER): Payer: Medicaid Other | Admitting: Physician Assistant

## 2021-09-06 ENCOUNTER — Telehealth (HOSPITAL_COMMUNITY): Payer: Self-pay | Admitting: Licensed Clinical Social Worker

## 2021-09-06 VITALS — BP 150/90 | HR 77 | Ht 75.0 in | Wt 310.0 lb

## 2021-09-06 DIAGNOSIS — F6381 Intermittent explosive disorder: Secondary | ICD-10-CM

## 2021-09-06 DIAGNOSIS — F411 Generalized anxiety disorder: Secondary | ICD-10-CM | POA: Diagnosis not present

## 2021-09-06 DIAGNOSIS — F331 Major depressive disorder, recurrent, moderate: Secondary | ICD-10-CM | POA: Diagnosis not present

## 2021-09-06 MED ORDER — TRAZODONE HCL 50 MG PO TABS: 50.0000 mg | ORAL_TABLET | Freq: Every evening | ORAL | 2 refills | Status: DC | PRN

## 2021-09-06 MED ORDER — FLUOXETINE HCL 20 MG PO CAPS: 20.0000 mg | ORAL_CAPSULE | Freq: Every day | ORAL | 2 refills | Status: DC
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
## 2021-09-09 ENCOUNTER — Encounter (HOSPITAL_COMMUNITY): Payer: Self-pay | Admitting: Physician Assistant

## 2021-09-10 ENCOUNTER — Ambulatory Visit (HOSPITAL_COMMUNITY): Payer: Medicaid Other | Admitting: Licensed Clinical Social Worker

## 2021-09-10 ENCOUNTER — Telehealth (HOSPITAL_COMMUNITY): Payer: Self-pay | Admitting: Licensed Clinical Social Worker

## 2021-09-10 NOTE — Progress Notes (Signed)
Virtual Visit via Video Note  I connected with Troy Phillips on 09/10/21 at  1:00 PM EDT by a video enabled telemedicine application and verified that I am speaking with the correct person using two identifiers.  Location: Patient: outdoors Provider: clinical home office   I discussed the limitations of evaluation and management by telemedicine and the availability of in person appointments. The patient expressed understanding and agreed to proceed.  I discussed the assessment and treatment plan with the patient. The patient was provided an opportunity to ask questions and all were answered. The patient agreed with the plan and demonstrated an understanding of the instructions.   The patient was advised to call back or seek an in-person evaluation if the symptoms worsen or if the condition fails to improve as anticipated.  I provided 5 minutes of non-face-to-face time during this encounter.   Heron Nay, LCSWA   Cln met with pt via Webex to discuss PHP. Pt stated he is seeking anger-management treatment. Cln informed him that PHP is treatment for individuals who are experiencing severe anxiety/depression symptoms and that while some of the coping skills taught could be used to cope with anger, PHP is not anger-management treatment. Per chart review and as confirmed by pt, he has been court-ordered to engage in anger management treatment. He stated he experiences some depression but denies SI. Per chart review, he is diagnosed with MDD moderate. Pt verbalized understanding that PHP would not be an appropriate level of care for him at this time based on his treatment goals and current symptoms. Cln offered to email pt other resources and suggestions and pt was agreeable to this. Cln securely emailed pt a link for [employer], [facility] and [facility] (both of which offer anger management support groups), and advised him in the email to contact [facility] directly  for to request additional providers in network that offer anger management treatment.

## 2021-09-11 ENCOUNTER — Ambulatory Visit (HOSPITAL_COMMUNITY): Payer: Medicaid Other

## 2021-10-16 ENCOUNTER — Ambulatory Visit (INDEPENDENT_AMBULATORY_CARE_PROVIDER_SITE_OTHER): Payer: Medicaid Other | Admitting: Licensed Clinical Social Worker

## 2021-10-16 DIAGNOSIS — F331 Major depressive disorder, recurrent, moderate: Secondary | ICD-10-CM
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
## 2021-11-05 ENCOUNTER — Ambulatory Visit (INDEPENDENT_AMBULATORY_CARE_PROVIDER_SITE_OTHER): Payer: Medicaid Other | Admitting: Licensed Clinical Social Worker

## 2021-11-05 DIAGNOSIS — F331 Major depressive disorder, recurrent, moderate: Secondary | ICD-10-CM

## 2021-11-05 DIAGNOSIS — F411 Generalized anxiety disorder: Secondary | ICD-10-CM

## 2021-11-05 NOTE — Plan of Care (Signed)
  Problem: Anxiety Disorder CCP Problem  1 GAD  Goal: STG: Patient will complete at least 80% of assigned homework Outcome: Progressing Goal: STG: Patient will practice problem solving skills 3 times per week for the next 4 weeks Outcome: Progressing   Problem: Depression CCP Problem  1 MDD  Goal: STG: Troy Phillips WILL PARTICIPATE IN AT LEAST 80% OF SCHEDULED INDIVIDUAL PSYCHOTHERAPY SESSIONS Outcome: Progressing Goal: STG: Troy Phillips WILL COMPLETE AT LEAST 80% OF ASSIGNED HOMEWORK Outcome: Progressing   Problem: Depression CCP Problem  1 MDD  Goal: LTG: Troy Phillips WILL SCORE LESS THAN 10 ON THE PATIENT HEALTH QUESTIONNAIRE (PHQ-9) Outcome: Not Progressing   Problem: Anxiety Disorder CCP Problem  1 GAD  Goal: STG: Patient will attend at least 80% of scheduled PHP sessions Outcome: Not Applicable   Problem: Depression CCP Problem  1 MDD  Goal: STG: Troy Phillips WILL ATTEND AT LEAST 80% OF SCHEDULED PHP SESSIONS Outcome: Not Applicable   Problem: Anxiety Disorder CCP Problem  1 GAD  Intervention: Discuss risks and benefits of medication treatment options for this problem and prescribe as indicated Intervention: Review results of GAD-7 with the patient to track progress Intervention: Perform psychoeducation regarding anxiety disorders   Problem: Depression CCP Problem  1 MDD  Intervention: WORK WITH Troy Phillips TO TRACK SYMPTOMS, TRIGGERS AND/OR SKILL USE THROUGH A MOOD CHART, DIARY CARD, OR JOURNAL Intervention: Administer the PHQ-9 or MADRS weekly for 4 weeks
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
## 2021-11-12 ENCOUNTER — Encounter (HOSPITAL_COMMUNITY): Payer: Self-pay | Admitting: Physician Assistant

## 2021-11-12 ENCOUNTER — Ambulatory Visit (INDEPENDENT_AMBULATORY_CARE_PROVIDER_SITE_OTHER): Payer: Medicaid Other | Admitting: Physician Assistant

## 2021-11-12 DIAGNOSIS — F331 Major depressive disorder, recurrent, moderate: Secondary | ICD-10-CM

## 2021-11-12 DIAGNOSIS — F411 Generalized anxiety disorder: Secondary | ICD-10-CM

## 2021-11-12 MED ORDER — FLUOXETINE HCL 20 MG PO CAPS: 20.0000 mg | ORAL_CAPSULE | Freq: Every day | ORAL | 2 refills | Status: DC

## 2021-11-12 MED ORDER — TRAZODONE HCL 50 MG PO TABS: 50.0000 mg | ORAL_TABLET | Freq: Every evening | ORAL | 2 refills | Status: DC | PRN

## 2021-11-12 NOTE — Progress Notes (Signed)
BH MD/PA/NP OP Progress Note  11/12/2021 1:48 PM Troy Phillips  MRN:  [id number]  Chief Complaint:  Chief Complaint  Patient presents with   Medication Management   HPI:   Troy Phillips is a 36 year old male with a past psychiatric history significant for major depressive disorder, generalized anxiety disorder, and intermittent explosive disorder who presents to [facility] for follow-up and medication management.  Patient is currently being managed on the following medications:  Trazodone 50 mg at bedtime Prozac 20 mg daily  Patient reports no issues or concerns regarding his current medication regimen.  Since taking his medications, patient states that his sleep has improved through the use of trazodone.  He reports not being as cranky as before because he is getting more consistent sleep.  Patient also states that stuff does not get to him since being placed on the medication.  Before reacting to an issue, patient states that he now thinks/talks out the issue before reacting.  Patient denies experiencing depression but states that he occasionally feels anxious at times.  Patient denies any new stressors at this time and states that he is focusing on getting his daughter ready for school this year.  A PHQ-9 screen was performed with the patient scoring 17.  A GAD-7 screen was also performed with the patient scoring a 6.  Patient is alert and oriented x 4, calm, cooperative, and fully engaged in conversation during the encounter.  Patient endorses good mood.  Patient denies suicidal or homicidal ideations.  He further denies auditory or visual hallucinations and does not appear to be responding to internal/external stimuli.  Patient endorses good sleep and receives on average 9 to 10 hours of sleep each night.  Patient endorses normal appetite needs on average 1 meal a day.  Patient endorses tobacco use and smokes on average a pack every day and a half.   Patient denies illicit drug use.  Visit Diagnosis:    ICD-10-CM   1. Moderate episode of recurrent major depressive disorder (HCC)  F33.1 FLUoxetine (PROZAC) 20 MG capsule    traZODone (DESYREL) 50 MG tablet    2. 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  Family Psychiatric History:  Father - patient is unsure of psychiatric diagnoses but states that his father was on many medications  Family History:  Family History  Problem Relation Age of Onset   Heart attack Father    Heart disease Maternal Grandmother    Hypertension Other     Social History:  Social History   Socioeconomic History   Marital status: Single    Spouse name: Not on file   Number of children: Not on file   Years of education: Not on file   Highest education level: Not on file  Occupational History   Not on file  Tobacco Use   Smoking status: Every Day    Packs/day: 0.25    Types: Cigarettes   Smokeless tobacco: Never  Vaping Use   Vaping Use: Never used  Substance and Sexual Activity   Alcohol use: Not Currently  Drug use: Yes    Types: Marijuana    Comment: Does smoke but not since he was released to mother home in [facility]   Sexual activity: Not Currently    Partners: Female  Other Topics Concern   Not on file  Social History Narrative   Not on file   Social Determinants of Health   Financial Resource Strain: Low Risk  (08/30/2021)   Overall Financial Resource Strain (CARDIA)    Difficulty of Paying Living Expenses: Not hard at all  Food Insecurity: No Food Insecurity (08/30/2021)   Hunger Vital Sign    Worried About Running Out of  Food in the Last Year: Never true    Ran Out of Food in the Last Year: Never true  Transportation Needs: No Transportation Needs (08/30/2021)   PRAPARE - Administrator, Civil Service (Medical): No    Lack of Transportation (Non-Medical): No  Physical Activity: Sufficiently Active (08/30/2021)   Exercise Vital Sign    Days of Exercise per Week: 7 days    Minutes of Exercise per Session: 60 min  Stress: Stress Concern Present (08/30/2021)   Troy Phillips [employer] Occupational Health - Occupational Stress Questionnaire    Feeling of Stress : Rather much  Social Connections: Moderately Isolated (08/30/2021)   Social Connection and Isolation Panel [NHANES]    Frequency of Communication with Friends and Family: Once a week    Frequency of Social Gatherings with Friends and Family: Once a week    Attends Religious Services: 1 to 4 times per year    Active Member of [employer] or Organizations: No    Attends Engineer, structural: Never    Marital Status: Married    Allergies: No Known Allergies  Metabolic Disorder Labs: No results found for: "HGBA1C", "MPG" No results found for: "PROLACTIN" No results found for: "CHOL", "TRIG", "HDL", "CHOLHDL", "VLDL", "LDLCALC" No results found for: "TSH"  Therapeutic Level Labs: No results found for: "LITHIUM" No results found for: "VALPROATE" No results found for: "CBMZ"  Current Medications: Current Outpatient Medications  Medication Sig Dispense Refill   albuterol (PROVENTIL HFA;VENTOLIN HFA) 108 (90 Base) MCG/ACT inhaler Inhale 2 puffs into the lungs every 6 (six) hours as needed for wheezing or shortness of breath.     cyclobenzaprine (FLEXERIL) 10 MG tablet Take 1 tablet (10 mg total) by mouth 2 (two) times daily as needed for muscle spasms. 20 tablet 0   diphenhydrAMINE (BENADRYL) 25 MG tablet Take 1 tablet (25 mg total) by mouth every 6 (six) hours as needed. (Patient taking differently: Take 25 mg by mouth every 6 (six) hours as  needed for itching. ) 30 tablet 0   FLUoxetine (PROZAC) 20 MG capsule Take 1 capsule (20 mg total) by mouth daily. 30 capsule 2   HYDROcodone-acetaminophen (NORCO/VICODIN) 5-325 MG tablet Take 1 tablet by mouth every 4 (four) hours as needed. (Patient not taking: Reported on 02/20/2020) 10 tablet 0   ibuprofen (ADVIL,MOTRIN) 800 MG tablet Take 1 tablet (800 mg total) by mouth 3 (three) times daily. (Patient not taking: Reported on 02/20/2020) 21 tablet 0   ondansetron (ZOFRAN ODT) 4 MG disintegrating tablet Take 1 tablet (4 mg total) by mouth every 8 (eight) hours as needed for nausea or vomiting. (Patient not taking: Reported on 02/20/2020) 20 tablet 0   traZODone (DESYREL) 50 MG tablet Take 1 tablet (50 mg total) by mouth at bedtime as needed for sleep. If sleep issues continue to occur, patient to take  an additional tablet as needed. 30 tablet 2   No current facility-administered medications for this visit.     Musculoskeletal: Strength & Muscle Tone: within normal limits Gait & Station: normal Patient leans: N/A  Psychiatric Specialty Exam: Review of Systems  Psychiatric/Behavioral:  Negative for decreased concentration, dysphoric mood, hallucinations, self-injury, sleep disturbance and suicidal ideas. The patient is not nervous/anxious and is not hyperactive.     Blood pressure (!) 169/116, pulse 80, height 6\' 3"  (1.905 m), weight 296 lb (134.3 kg), SpO2 96 %.Body mass index is 37 kg/m.  General Appearance: Fairly Groomed  Eye Contact:  Good  Speech:  Clear and Coherent and Normal Rate  Volume:  Normal  Mood:  Euthymic  Affect:  Appropriate  Thought Process:  Coherent and Descriptions of Associations: Intact  Orientation:  Full (Time, Place, and Person)  Thought Content: WDL   Suicidal Thoughts:  No  Homicidal Thoughts:  No  Memory:  Immediate;   Good Recent;   Good Remote;   Fair  Judgement:  Good  Insight:  Fair  Psychomotor Activity:  Normal  Concentration:   Concentration: Good and Attention Span: Good  Recall:  Good  Fund of Knowledge: Fair  Language: Good  Akathisia:  No  Handed:  Right  AIMS (if indicated): not done  Assets:  Communication Skills Desire for Improvement Housing Social Support Transportation  ADL's:  Intact  Cognition: WNL  Sleep:  Poor   Screenings: GAD-7    Flowsheet Row Office Visit from 11/12/2021 in [facility] Counselor from 11/05/2021 in [facility] Counselor from 10/16/2021 in [facility] Office Visit from 09[facility]06/2021 in [facility] Counselor from 08/30/2021 in [facility]  Total GAD-7 Score 15 6 12 14 6       PHQ2-9    [place] Office Visit from 11/12/2021 in [facility] Counselor from 11/05/2021 in [facility] Counselor from 10/16/2021 in [facility] Office Visit from 09/06/2021 in [facility] Counselor from 08/30/2021 in [facility]  PHQ-2 Total Score 3 4 3 3 2   PHQ-9 Total Score 15 17 12 17 10       [place] Visit from 11/12/2021 in [facility] Office Visit from 09/06/2021 in [facility] Counselor from 08/30/2021 in [place] No Risk No Risk No Risk        Assessment and Plan:   Troy Phillips is a 36 year old male with a past psychiatric history significant for major depressive disorder, generalized anxiety disorder, and intermittent explosive disorder who presents to [facility] for follow-up and medication management.  Patient reports no issues or concerns regarding his current medication regimen.  Patient denies depressive symptoms but states that he occasionally  experiences anxiety.  Patient appears stable on his current medication regimen and denies the need for dosage adjustments at this time.  Patient's medications to be e-prescribed to pharmacy of choice.  Collaboration of Care: Collaboration of Care: Medication Management AEB provider managing patient's psychiatric medications, Psychiatrist AEB patient being followed by mental health provider, and Referral or follow-up with counselor/therapist AEB patient being seen by a licensed clinical social worker at this facility  Patient/Guardian was advised Release of Information must be obtained prior to any record release in order to collaborate  their care with an outside provider. Patient/Guardian was advised if they have not already done so to contact the registration department to sign all necessary forms in order for [place] to release information regarding their care.   Consent: Patient/Guardian gives verbal consent for treatment and assignment of benefits for services provided during this visit. Patient/Guardian expressed understanding and agreed to proceed.   1. Moderate episode of recurrent major depressive disorder (HCC)  - FLUoxetine (PROZAC) 20 MG capsule; Take 1 capsule (20 mg total) by mouth daily.  Dispense: 30 capsule; Refill: 2 - traZODone (DESYREL) 50 MG tablet; Take 1 tablet (50 mg total) by mouth at bedtime as needed for sleep. If sleep issues continue to occur, patient to take an additional tablet as needed.  Dispense: 30 tablet; Refill: 2  2. GAD (generalized anxiety disorder)  - FLUoxetine (PROZAC) 20 MG capsule; Take 1 capsule (20 mg total) by mouth daily.  Dispense: 30 capsule; Refill: 2  Patient to follow up in 2 months Provider spent a total of 11 minutes with the patient/reviewing patient's chart  Meta Hatchet, PA 11/12/2021, 1:48 PM

## 2021-11-26 ENCOUNTER — Ambulatory Visit (INDEPENDENT_AMBULATORY_CARE_PROVIDER_SITE_OTHER): Payer: Medicaid Other | Admitting: Licensed Clinical Social Worker

## 2021-11-26 DIAGNOSIS — F411 Generalized anxiety disorder: Secondary | ICD-10-CM

## 2021-11-26 DIAGNOSIS — F331 Major depressive disorder, recurrent, moderate: Secondary | ICD-10-CM | POA: Diagnosis not present

## 2021-11-26 NOTE — Progress Notes (Addendum)
   THERAPIST PROGRESS NOTE  Session Time: 63  Participation Level: Active  Behavioral Response: CasualAlertAnxious and Depressed  Type of Therapy: Individual Therapy  Treatment Goals addressed: decrease GAD-7 below 10   ProgressTowards Goals: Progressing  Interventions: CBT, Motivational Interviewing, and Supportive  Summary: Troy Phillips is a 36 y.o. male who presents with depressed and flat mood\affect.  Patient was pleasant, cooperative, maintained good eye contact.  Vincenza Hews was alert and oriented x5.  Patient engaged well in therapy session was dressed casually.  Reports primary stressors as irritability, housing, and ex relationship\legal case.  Patient reports he continues to be compliant with court orders for a 50-B that was taken out against him from his wife.  Patient reports he continues to be compliant with ankle monitoring system.  Patient reports that this has been a struggle to find housing as wants he is excepted into housing the housing\leasing office needs to contact the sheriff's department to confirm information of new address.  Patient reports this has been a struggle to get people to be compliant or except him for a tenant.  Patient reports this is increased tension with his mother who he currently lives with in a 1 bedroom apartment.  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  Patient reports that his ex wife has been coming around more more even though she is not supposed to be within a certain mileage of him.  Patient reports that she will come to his mother's house and tried to talk to him.  Patient reports utilizing calling the police when this happens to remain in compliance with his 50-B form.   Suicidal/Homicidal: Nowithout intent/plan  Therapist Response:    Intervention/Plan: LCSW supportive therapy for praise and encouragement.  LCSW use psychoanalytic therapy for patient  to express thoughts, feelings, and emotions.  LCSW used unconditional positive regard for CBT therapy.  LCSW provided patient with resources to "thought defusion".  LCSW advised patient to use worksheet when he "feels triggered, thoughts are one-sided, and when he has lost some thought".  LCSW went through techniques for "to put her thoughts in a cloud, file your thoughts away, click the "X button", and zoom out".  Patient to utilize this technique at least 1 time per week.  Patient to continue to use coping skills for going to church and [employer] as well as engage in [place] with his cousin's car shop.  Plan: Return again in 3 weeks.  Diagnosis: Moderate episode of recurrent major depressive disorder (HCC)  GAD (generalized anxiety disorder)  Collaboration of Care: Other None today  Patient/Guardian was advised Release of Information must be obtained prior to any record release in order to collaborate their care with an outside provider. Patient/Guardian was advised if they have not already done so to contact the registration department to sign all necessary forms in order for [place] to release information regarding their care.   Consent: Patient/Guardian gives verbal consent for treatment and assignment of benefits for services provided during this visit. Patient/Guardian expressed understanding and agreed to proceed.   [name], LCSW 11/26/2021

## 2021-12-17 ENCOUNTER — Ambulatory Visit (INDEPENDENT_AMBULATORY_CARE_PROVIDER_SITE_OTHER): Payer: Medicaid Other | Admitting: Licensed Clinical Social Worker

## 2021-12-17 DIAGNOSIS — F6381 Intermittent explosive disorder: Secondary | ICD-10-CM | POA: Diagnosis not present

## 2021-12-17 DIAGNOSIS — F331 Major depressive disorder, recurrent, moderate: Secondary | ICD-10-CM

## 2021-12-17 DIAGNOSIS — F411 Generalized anxiety disorder: Secondary | ICD-10-CM

## 2021-12-17 NOTE — Progress Notes (Signed)
   THERAPIST PROGRESS NOTE  Session Time: 90  Participation Level: Active  Behavioral Response: CasualAlertAnxious and Depressed  Type of Therapy: Individual Therapy  Treatment Goals addressed: Patient will complete at least 80% of assigned homework  ProgressTowards Goals: Not Progressing  Interventions: CBT, Motivational Interviewing, and Supportive  Summary: Troy Phillips is a 36 y.o. male who presents with flat and depressed mood\affect.  Patient was pleasant, cooperative, maintained good eye contact.  He engaged well in therapy session was dressed casually.  Patient was alert and oriented x5.  Troy Phillips reports primary stressors as legal, ex relationship, and family conflict.  Patient reports that he has been ordered to mandated mental health record for the next 6 months.  Patient states lack of trust within the court system due to information being obtained by his wife before he was even told the information about his mental health symptoms.  Patient reports building trust and rapport at [facility] which is why he enjoys coming to sessions voluntarily.  LCSW administered the GAD-7.  LCSW administered the PHQ-9.  LCSW notes an increase in both GAD-7 and PHQ-9.  Patient reports increased due to lack of trust within the court system due to his wife finding out about punishments prior to his knowledge.  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  Suicidal/Homicidal: Nowithout intent/plan  Therapist Response:    Intervention/Plan: LCSWused supportive therapy for praise and encouragement.  LCSW used motivational interviewing for open-ended questions, reflective listening, and positive affirmations.  LCSW used empowerment and person centered therapy for unconditional positive regard.  Plan for patient is to continue to utilize coping skills such as  interactions with daughter, going to [employer], and engaging in work at a [employer].  Plan: Return again in 3 weeks.  Diagnosis: Intermittent explosive disorder in adult  Moderate episode of recurrent major depressive disorder (HCC)  GAD (generalized anxiety disorder)  Collaboration of Care: Other None today   Patient/Guardian was advised Release of Information must be obtained prior to any record release in order to collaborate their care with an outside provider. Patient/Guardian was advised if they have not already done so to contact the registration department to sign all necessary forms in order for [place] to release information regarding their care.   Consent: Patient/Guardian gives verbal consent for treatment and assignment of benefits for services provided during this visit. Patient/Guardian expressed understanding and agreed to proceed.   Dory Horn, LCSW 12/17/2021

## 2021-12-17 NOTE — Plan of Care (Signed)
  Problem: Anxiety Disorder CCP Problem  1 GAD  Goal: STG: Patient will complete at least 80% of assigned homework Outcome: Progressing Goal: STG: Patient will practice problem solving skills 3 times per week for the next 4 weeks Outcome: Progressing   Problem: Depression CCP Problem  1 MDD  Goal: STG: Gregori WILL PARTICIPATE IN AT LEAST 80% OF SCHEDULED INDIVIDUAL PSYCHOTHERAPY SESSIONS Outcome: Progressing Goal: STG: Damyan WILL COMPLETE AT LEAST 80% OF ASSIGNED HOMEWORK Outcome: Progressing   Problem: Depression CCP Problem  1 MDD  Goal: LTG: July WILL SCORE LESS THAN 10 ON THE PATIENT HEALTH QUESTIONNAIRE (PHQ-9) Outcome: Not Progressing   Problem: Anxiety Disorder CCP Problem  1 GAD  Intervention: Encourage patient to take psychotropic medication as prescribed Intervention: Work with patient to track symptoms, triggers and/or skill use through a mood chart, diary card, or journal Intervention: Provide patient with educational information and reading material on anxiety, its causes, and symptoms Intervention: Work with patient to identify the major components of a recent episode of anxiety: physical symptoms, major thoughts and images, and major behaviors they experienced   Problem: Depression CCP Problem  1 MDD  Intervention: ENCOURAGE Syre TO PARTICIPATE IN RECOVERY PEER SUPPORT ACTIVITIES WEEKLY Intervention: PROVIDE Dickie WITH EDUCATIONAL INFORMATION AND READING MATERIAL ON DISSOCIATION, ITS CAUSES, AND SYMPTOMS Intervention: WORK WITH Mikle TO IDENTIFY THE MAJOR COMPONENTS OF A RECENT EPISODE OF DEPRESSION: PHYSICAL SYMPTOMS, MAJOR THOUGHTS AND IMAGES, AND MAJOR BEHAVIORS THEY EXPERIENCED Intervention: WORK WITH Bren TO IDENTIFY THEIR 3 PERSONAL GOALS FOR MANAGING DEPRESSION SYMPTOMS AND ADD TO THIS PLAN Intervention: EDUCATE Jaquarius ON COGNITIVE DISTORTIONS AND THE RATIONALE FOR TREATMENT OF DEPRESSION

## 2021-12-31 ENCOUNTER — Encounter: Payer: Medicaid Other | Admitting: Family Medicine

## 2022-01-01 ENCOUNTER — Emergency Department (HOSPITAL_BASED_OUTPATIENT_CLINIC_OR_DEPARTMENT_OTHER): Payer: Medicaid Other

## 2022-01-01 ENCOUNTER — Emergency Department (HOSPITAL_BASED_OUTPATIENT_CLINIC_OR_DEPARTMENT_OTHER)
Admission: EM | Admit: 2022-01-01 | Discharge: 2022-01-01 | Disposition: A | Discharge status: Home / Self Care | Payer: Medicaid Other | Attending: Emergency Medicine | Admitting: Emergency Medicine

## 2022-01-01 ENCOUNTER — Encounter (HOSPITAL_BASED_OUTPATIENT_CLINIC_OR_DEPARTMENT_OTHER): Payer: Self-pay

## 2022-01-01 ENCOUNTER — Other Ambulatory Visit: Payer: Self-pay

## 2022-01-01 ENCOUNTER — Emergency Department (HOSPITAL_BASED_OUTPATIENT_CLINIC_OR_DEPARTMENT_OTHER): Payer: Medicaid Other | Admitting: Radiology

## 2022-01-01 DIAGNOSIS — M25531 Pain in right wrist: Secondary | ICD-10-CM | POA: Insufficient documentation

## 2022-01-01 DIAGNOSIS — Y92410 Unspecified street and highway as the place of occurrence of the external cause: Secondary | ICD-10-CM | POA: Diagnosis not present

## 2022-01-01 DIAGNOSIS — M25561 Pain in right knee: Secondary | ICD-10-CM | POA: Diagnosis not present

## 2022-01-01 MED ORDER — LIDOCAINE 5 % EX PTCH
1.0000 | MEDICATED_PATCH | CUTANEOUS | Status: DC
  Administered 2022-01-01: 1 via TRANSDERMAL
  Filled 2022-01-01: qty 1

## 2022-01-01 MED ORDER — IBUPROFEN 800 MG PO TABS
800.0000 mg | ORAL_TABLET | Freq: Once | ORAL | Status: AC
  Administered 2022-01-01: 800 mg via ORAL
  Filled 2022-01-01: qty 1
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
## 2022-01-07 ENCOUNTER — Ambulatory Visit (INDEPENDENT_AMBULATORY_CARE_PROVIDER_SITE_OTHER): Payer: Medicaid Other | Admitting: Licensed Clinical Social Worker

## 2022-01-07 DIAGNOSIS — F411 Generalized anxiety disorder: Secondary | ICD-10-CM

## 2022-01-07 DIAGNOSIS — F6381 Intermittent explosive disorder: Secondary | ICD-10-CM | POA: Diagnosis not present

## 2022-01-07 DIAGNOSIS — F331 Major depressive disorder, recurrent, moderate: Secondary | ICD-10-CM
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
## 2022-01-07 NOTE — Plan of Care (Signed)
  Problem: Anxiety Disorder CCP Problem  1 GAD  Goal: STG: Patient will complete at least 80% of assigned homework Outcome: Progressing Goal: STG: Patient will practice problem solving skills 3 times per week for the next 4 weeks Outcome: Progressing   Problem: Depression CCP Problem  1 MDD  Goal: LTG: Trentan WILL SCORE LESS THAN 10 ON THE PATIENT HEALTH QUESTIONNAIRE (PHQ-9) Outcome: Progressing Goal: STG: Marcell WILL PARTICIPATE IN AT LEAST 80% OF SCHEDULED INDIVIDUAL PSYCHOTHERAPY SESSIONS Outcome: Progressing Goal: STG: Takari WILL COMPLETE AT LEAST 80% OF ASSIGNED HOMEWORK Outcome: Progressing

## 2022-01-14 ENCOUNTER — Encounter (HOSPITAL_COMMUNITY): Payer: Self-pay | Admitting: Student in an Organized Health Care Education/Training Program

## 2022-01-14 ENCOUNTER — Ambulatory Visit (INDEPENDENT_AMBULATORY_CARE_PROVIDER_SITE_OTHER): Payer: Medicaid Other | Admitting: Student in an Organized Health Care Education/Training Program

## 2022-01-14 ENCOUNTER — Encounter (HOSPITAL_COMMUNITY): Payer: Medicaid Other | Admitting: Physician Assistant

## 2022-01-14 DIAGNOSIS — F411 Generalized anxiety disorder: Secondary | ICD-10-CM

## 2022-01-14 DIAGNOSIS — F331 Major depressive disorder, recurrent, moderate: Secondary | ICD-10-CM | POA: Diagnosis not present

## 2022-01-14 MED ORDER — FLUOXETINE HCL 20 MG PO CAPS: 20.0000 mg | ORAL_CAPSULE | Freq: Every day | ORAL | 2 refills | Status: DC

## 2022-01-14 MED ORDER — TRAZODONE HCL 50 MG PO TABS: 50.0000 mg | ORAL_TABLET | Freq: Every evening | ORAL | 2 refills | Status: DC | PRN
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
## 2022-01-21 NOTE — Addendum Note (Signed)
Addended by: Briant Cedar on: 01/21/2022 11:43 AM   Modules accepted: Orders

## 2022-01-29 ENCOUNTER — Ambulatory Visit (INDEPENDENT_AMBULATORY_CARE_PROVIDER_SITE_OTHER): Payer: Medicaid Other | Admitting: Licensed Clinical Social Worker

## 2022-01-29 DIAGNOSIS — F331 Major depressive disorder, recurrent, moderate: Secondary | ICD-10-CM | POA: Diagnosis not present

## 2022-01-29 DIAGNOSIS — F411 Generalized anxiety disorder: Secondary | ICD-10-CM
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
## 2022-02-18 ENCOUNTER — Ambulatory Visit (INDEPENDENT_AMBULATORY_CARE_PROVIDER_SITE_OTHER): Payer: Medicaid Other | Admitting: Licensed Clinical Social Worker

## 2022-02-18 DIAGNOSIS — F331 Major depressive disorder, recurrent, moderate: Secondary | ICD-10-CM | POA: Diagnosis not present

## 2022-02-18 DIAGNOSIS — F411 Generalized anxiety disorder: Secondary | ICD-10-CM

## 2022-02-18 NOTE — Plan of Care (Signed)
  Problem: Anxiety Disorder CCP Problem  1 GAD  Goal: STG: Patient will complete at least 80% of assigned homework Outcome: Progressing Goal: STG: Patient will practice problem solving skills 3 times per week for the next 4 weeks Outcome: Progressing   Problem: Depression CCP Problem  1 MDD  Goal: STG: Kiree WILL PARTICIPATE IN AT LEAST 80% OF SCHEDULED INDIVIDUAL PSYCHOTHERAPY SESSIONS Outcome: Progressing Goal: STG: Nathon WILL COMPLETE AT LEAST 80% OF ASSIGNED HOMEWORK Outcome: Progressing   Problem: Depression CCP Problem  1 MDD  Goal: LTG: Avinash WILL SCORE LESS THAN 10 ON THE PATIENT HEALTH QUESTIONNAIRE (PHQ-9) Outcome: Completed/Met

## 2022-02-18 NOTE — Progress Notes (Signed)
   THERAPIST PROGRESS NOTE  Session Time: 20  Participation Level: Active  Behavioral Response: CasualAlertAnxious and Depressed  Type of Therapy: Individual Therapy  Problem: Depression CCP Problem  1 MDD  Goal: LTG: Ion WILL SCORE LESS THAN 10 ON THE PATIENT HEALTH QUESTIONNAIRE (PHQ-9) Outcome: Completed/Met  ProgressTowards Goals: Progressing  Interventions: CBT and Motivational Interviewing  Summary: Troy Phillips is a 36 y.o. male who presents with depressed and anxious mood\affect.  Troy Phillips was alert and oriented x5.  Patient was pleasant, cooperative, maintained good eye contact.  He engaged well in therapy session was dressed casually.  Primary stressor for today is legal problems.  Patient reports that he still has his ankle monitor on after several months of dealing with a plea deal that we will get his ankle monitor taken off.  Patient reports that he has struggled with getting a hold of his lawyer as patient has been to court multiple times to sign the plea deal but the plea deal is not what was negotiated by his lawyer.  Patient reports that he has finally gotten a hold of his lawyer and will be in court next week to get things sorted out patient only has to do mental health court for 6 to 8 months.  Current deal that they want him at this time is 6 months probation and 6 months mental health court.  Suicidal/Homicidal: Nowithout intent/plan  Therapist Response:    Intervention/Plan: LCSW administered a PHQ-9.  LCSW administered a GAD-7.  LCSW notes goals for GAD-7 as below a 5 and for PHQ-9 below a 10.  Patient met goals for both PHQ-9 and GAD-7.  LCSW used supportive therapy for praise and encouragement.  LCSW used person centered therapy for empowerment.  LCSW used psychoanalytic therapy for patient to express thoughts, feelings, and emotions in session.  Plan: Return again in 3 weeks.  Diagnosis: Moderate episode of recurrent major depressive disorder (HCC)  GAD  (generalized anxiety disorder)  Collaboration of Care: Other None today   Patient/Guardian was advised Release of Information must be obtained prior to any record release in order to collaborate their care with an outside provider. Patient/Guardian was advised if they have not already done so to contact the registration department to sign all necessary forms in order for [place] to release information regarding their care.   Consent: Patient/Guardian gives verbal consent for treatment and assignment of benefits for services provided during this visit. Patient/Guardian expressed understanding and agreed to proceed.   Dory Horn, LCSW 02/18/2022

## 2022-03-11 ENCOUNTER — Ambulatory Visit (INDEPENDENT_AMBULATORY_CARE_PROVIDER_SITE_OTHER): Payer: Medicaid Other | Admitting: Licensed Clinical Social Worker

## 2022-03-11 DIAGNOSIS — F6381 Intermittent explosive disorder: Secondary | ICD-10-CM

## 2022-03-11 DIAGNOSIS — F331 Major depressive disorder, recurrent, moderate: Secondary | ICD-10-CM

## 2022-03-11 DIAGNOSIS — F411 Generalized anxiety disorder: Secondary | ICD-10-CM

## 2022-03-11 NOTE — Progress Notes (Signed)
   THERAPIST PROGRESS NOTE  Session Time: 6   Participation Level: Active  Behavioral Response: CasualAlertAnxious and Depressed  Type of Therapy: Individual Therapy  Treatment Goals addressed: Decrease PHQ-9 below a 10.  ProgressTowards Goals: Met  Interventions: Motivational Interviewing and Supportive  Summary: Troy Phillips is a 36 y.o. male who presents with flat and depressed mood\affect.  Patient was pleasant and cooperative, maintained good eye contact.  He engaged well in therapy session was dressed casually.  Patient comes in today with primary stressor as legal.  Troy Phillips reports that he just got out of the court system and again his court case has been continued.  Patient reports frustration, tension, and worry as they have continued the case multiple times.  Troy Phillips reports disorganization by the prosecuting attorneys.  Patient reports that his wife has never showed up to any of the court hearings.  And they also did not know that he was on the ankle monitor or contributing to mental health services at [facility].  Troy Phillips reports that his Nurse, adult is optimistic that the court case will either get dismissed or a plea deal will be reached.  Patient reports that the judge is going to subpoena his wife 1 last time and if she does not show up the case could possibly be dismissed.  Troy Phillips reports wanting to stay in compliance with his ankle monitor, therapy sessions, and medication management.  Patient reports that he has been taking medications as prescribed and also has not missed a therapy appointment to date.  Suicidal/Homicidal: Nowithout intent/plan  Therapist Response:    Intervention/Plan: LCSW supportive therapy for praise and encouragement.  LCSW psychoanalytic therapy for patient to express thoughts, feelings and emotions.  LCSW used motivational interviewing for open-ended questions, reflective listening, and positive affirmations.  LCSW to  remain in session.  LCSW administered GAD-7.  LCSW administered a PHQ-9.   Plan: Return again in 3 weeks.  Diagnosis: Moderate episode of recurrent major depressive disorder (HCC)  GAD (generalized anxiety disorder)  Intermittent explosive disorder in adult  Collaboration of Care: Other None today   Patient/Guardian was advised Release of Information must be obtained prior to any record release in order to collaborate their care with an outside provider. Patient/Guardian was advised if they have not already done so to contact the registration department to sign all necessary forms in order for [place] to release information regarding their care.   Consent: Patient/Guardian gives verbal consent for treatment and assignment of benefits for services provided during this visit. Patient/Guardian expressed understanding and agreed to proceed.   Dory Horn, LCSW 03/11/2022

## 2022-04-15 ENCOUNTER — Ambulatory Visit (HOSPITAL_COMMUNITY): Payer: Medicaid Other | Admitting: Licensed Clinical Social Worker

## 2022-04-15 DIAGNOSIS — F331 Major depressive disorder, recurrent, moderate: Secondary | ICD-10-CM

## 2022-04-15 DIAGNOSIS — F411 Generalized anxiety disorder: Secondary | ICD-10-CM

## 2022-04-15 NOTE — Progress Notes (Signed)
THERAPIST PROGRESS NOTE  Virtual Visit via Video Note  I connected with Troy Phillips on 04/15/22 at  9:00 AM EST by a video enabled telemedicine application and verified that I am speaking with the correct person using two identifiers.  Location: Patient: [facility]  Provider: Providers Home    I discussed the limitations of evaluation and management by telemedicine and the availability of in person appointments. The patient expressed understanding and agreed to proceed.     I discussed the assessment and treatment plan with the patient. The patient was provided an opportunity to ask questions and all were answered. The patient agreed with the plan and demonstrated an understanding of the instructions.   The patient was advised to call back or seek an in-person evaluation if the symptoms worsen or if the condition fails to improve as anticipated.  I provided 30 minutes of non-face-to-face time during this encounter.   Dory Horn, LCSW   Participation Level: Active  Behavioral Response: CasualAlertDepressed  Type of Therapy: Individual Therapy  Treatment Goals addressed:  Active     Anxiety Disorder CCP Problem  1 GAD      STG: Patient will attend at least 80% of scheduled PHP sessions (Not Applicable)     Start:  08/30/21    Expected End:  02/27/22    Resolved:  11/05/21      STG: Patient will complete at least 80% of assigned homework (Progressing)     Start:  08/30/21    Expected End:  08/29/22         STG: Patient will practice problem solving skills 3 times per week for the next 4 weeks (Progressing)     Start:  08/30/21    Expected End:  08/29/22       Goal Note     Pt has been utilizing coping skills such as investing time into work and family            Depression CCP Problem  1 MDD      LTG: Troy Phillips WILL SCORE LESS THAN 10 ON THE PATIENT HEALTH QUESTIONNAIRE (PHQ-9) (Completed/Met)     Start:  08/30/21    Expected End:  03/28/22     Resolved:  02/18/22      STG: Troy Phillips WILL ATTEND AT LEAST 80% OF SCHEDULED PHP SESSIONS (Not Applicable)     Start:  08/30/21    Expected End:  10/16/21    Resolved:  11/05/21      STG: Troy Phillips WILL PARTICIPATE IN AT LEAST 80% OF SCHEDULED INDIVIDUAL PSYCHOTHERAPY SESSIONS (Progressing)     Start:  08/30/21    Expected End:  08/29/22         STG: Troy Phillips WILL COMPLETE AT LEAST 80% OF ASSIGNED HOMEWORK (Progressing)     Start:  08/30/21    Expected End:  08/29/22             Interventions: Motivational Interviewing and Supportive   Suicidal/Homicidal: Nowithout intent/plan  Therapist Response:     Pt was alert and oriented x 5 He was dressed casually and engaged well in therapy session. He presented today with depressed and anxious mood/affect. He was pleasant, cooperative and maintained good eye contact.   Pt reports stressors for legal and work. Pt reports that he still has a case pending for domestic violence charges. Pt reports that he has been compliant with the court system and has been present at all hearings. He states that the DA wants to  drop the charges down to misdemeanor and pt will get probation. Pt reports that he will be agreeable to this but must wait for the next court hearing in 1 month. Pt states that work has also been stressful. He states that he still has an ankle monitor on. Because of the ankle monitor he is not supposed to leave the county of [place]. 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   Intervention/Plan: LCSW administer a PHQ-9. LCSW administered a GAD-7. LCSW notes an increase in PHQ-9 pt still remains below a 10 which is a goal within his treatment plan. LCSW administered a GAD-7 and pt increase his score by 2. Pt attributes this to legal case pending against him. LCSW used motivational  interview for open ended questions, reflective listening, and positive affirmations. LCSW used supportive therapy for praise and encouragement.   Plan: Return again in 4 weeks.  Diagnosis: No diagnosis found.  Collaboration of Care: Other None today   Patient/Guardian was advised Release of Information must be obtained prior to any record release in order to collaborate their care with an outside provider. Patient/Guardian was advised if they have not already done so to contact the registration department to sign all necessary forms in order for [place] to release information regarding their care.   Consent: Patient/Guardian gives verbal consent for treatment and assignment of benefits for services provided during this visit. Patient/Guardian expressed understanding and agreed to proceed.   Dory Horn, LCSW 04/15/2022

## 2022-04-18 ENCOUNTER — Encounter (HOSPITAL_COMMUNITY): Payer: Self-pay | Admitting: Student in an Organized Health Care Education/Training Program

## 2022-04-18 ENCOUNTER — Ambulatory Visit (INDEPENDENT_AMBULATORY_CARE_PROVIDER_SITE_OTHER): Payer: Medicaid Other | Admitting: Student in an Organized Health Care Education/Training Program

## 2022-04-18 DIAGNOSIS — F411 Generalized anxiety disorder: Secondary | ICD-10-CM | POA: Diagnosis not present

## 2022-04-18 DIAGNOSIS — F331 Major depressive disorder, recurrent, moderate: Secondary | ICD-10-CM

## 2022-04-18 MED ORDER — TRAZODONE HCL 50 MG PO TABS: 50.0000 mg | ORAL_TABLET | Freq: Every evening | ORAL | 2 refills | Status: DC | PRN

## 2022-04-18 MED ORDER — FLUOXETINE HCL 20 MG PO CAPS: 20.0000 mg | ORAL_CAPSULE | Freq: Every day | ORAL | 2 refills | Status: DC

## 2022-04-18 NOTE — Addendum Note (Signed)
Addended by: Briant Cedar on: 04/18/2022 10:26 AM   Modules accepted: Orders

## 2022-04-18 NOTE — Progress Notes (Signed)
[facility] MD/PA/NP OP Progress Note  04/18/2022 8:20 AM Troy Phillips  MRN:  [id number]  Chief Complaint:  Chief Complaint  Patient presents with   Follow-up   Medication Refill   HPI:  Troy Phillips is a 37 yr old male who presents for follow up and medication management. PPHx is significant for MDD, GAD, and Intermittent Explosive Disorder.    He reports that he has continued to do well since our last appointment.  He reports no side effects or issues with his medications.  He reports his mom has been mentioning how much better he is doing.  He reports that his therapy sessions have been going well.  He reports no other changes to his health.  Discussed with him that we would not change his medications and send in refills and he was agreeable with this.  He reports his sleep is doing much better.  He reports his appetite is doing good.  He reports no SI, HI, or AVH.  He will return follow-up in approximately 3 months.   Visit Diagnosis:    ICD-10-CM   1. GAD (generalized anxiety disorder)  F41.1 FLUoxetine (PROZAC) 20 MG capsule    2. Moderate episode of recurrent major depressive disorder (HCC)  F33.1 FLUoxetine (PROZAC) 20 MG capsule    traZODone (DESYREL) 50 MG tablet      Past Psychiatric History: MDD, GAD, and Intermittent Explosive Disorder.   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  Family Psychiatric History: Father - patient is unsure of psychiatric diagnoses but states that his father was on many medications   Family History:  Family History  Problem Relation Age of Onset   Heart attack Father    Heart disease Maternal Grandmother    Hypertension Other     Social History:  Social History   Socioeconomic History   Marital status: Single    Spouse name: Not on file   Number of children: Not on file   Years  of education: Not on file   Highest education level: Not on file  Occupational History   Not on file  Tobacco Use   Smoking status: Every Day    Packs/day: 0.25    Types: Cigarettes   Smokeless tobacco: Never  Vaping Use   Vaping Use: Never used  Substance and Sexual Activity   Alcohol use: Not Currently   Drug use: Yes    Types: Marijuana    Comment: Does smoke but not since he was released to mother home in [facility]   Sexual activity: Not Currently    Partners: Female  Other Topics Concern   Not on file  Social History Narrative   Not on file   Social Determinants of Health   Financial Resource Strain: Low Risk  (08/30/2021)   Overall Financial Resource Strain (CARDIA)    Difficulty of Paying Living Expenses: Not hard at all  Food Insecurity: No Food Insecurity (08/30/2021)   Hunger Vital Sign    Worried About Running Out of Food in the Last Year: Never true    [place] in the Last Year: Never true  Transportation Needs: No Transportation Needs (08/30/2021)   PRAPARE - Transportation    Lack of Transportation (Medical): No    Lack of  Transportation (Non-Medical): No  Physical Activity: Sufficiently Active (08/30/2021)   Exercise Vital Sign    Days of Exercise per Week: 7 days    Minutes of Exercise per Session: 60 min  Stress: Stress Concern Present (08/30/2021)   [place]    Feeling of Stress : Rather much  Social Connections: Moderately Isolated (08/30/2021)   Social Connection and Isolation Panel [NHANES]    Frequency of Communication with Friends and Family: Once a week    Frequency of Social Gatherings with Friends and Family: Once a week    Attends Religious Services: 1 to 4 times per year    Active Member of [employer] or Organizations: No    Attends Music therapist: Never    Marital Status: Married    Allergies: No Known Allergies  Metabolic Disorder Labs: No results found  for: "HGBA1C", "MPG" No results found for: "PROLACTIN" No results found for: "CHOL", "TRIG", "HDL", "CHOLHDL", "VLDL", "LDLCALC" No results found for: "TSH"  Therapeutic Level Labs: No results found for: "LITHIUM" No results found for: "VALPROATE" No results found for: "CBMZ"  Current Medications: Current Outpatient Medications  Medication Sig Dispense Refill   albuterol (PROVENTIL HFA;VENTOLIN HFA) 108 (90 Base) MCG/ACT inhaler Inhale 2 puffs into the lungs every 6 (six) hours as needed for wheezing or shortness of breath.     diphenhydrAMINE (BENADRYL) 25 MG tablet Take 1 tablet (25 mg total) by mouth every 6 (six) hours as needed. (Patient taking differently: Take 25 mg by mouth every 6 (six) hours as needed for itching. ) 30 tablet 0   FLUoxetine (PROZAC) 20 MG capsule Take 1 capsule (20 mg total) by mouth daily. 30 capsule 2   ibuprofen (ADVIL,MOTRIN) 800 MG tablet Take 1 tablet (800 mg total) by mouth 3 (three) times daily. (Patient not taking: Reported on 02/20/2020) 21 tablet 0   ondansetron (ZOFRAN ODT) 4 MG disintegrating tablet Take 1 tablet (4 mg total) by mouth every 8 (eight) hours as needed for nausea or vomiting. (Patient not taking: Reported on 02/20/2020) 20 tablet 0   traZODone (DESYREL) 50 MG tablet Take 1 tablet (50 mg total) by mouth at bedtime as needed for sleep. If sleep issues continue to occur, patient to take an additional tablet as needed. 30 tablet 2   No current facility-administered medications for this visit.     Musculoskeletal: Strength & Muscle Tone: within normal limits Gait & Station: normal Patient leans: N/A  Psychiatric Specialty Exam: Review of Systems  Respiratory:  Negative for cough and shortness of breath.   Cardiovascular:  Negative for chest pain.  Gastrointestinal:  Negative for abdominal pain, constipation, diarrhea, nausea and vomiting.  Neurological:  Negative for weakness and headaches.  Psychiatric/Behavioral:  Negative for  dysphoric mood, hallucinations, sleep disturbance and suicidal ideas. The patient is not nervous/anxious.     Blood pressure (!) 147/104, pulse 88, height 6\' 3"  (1.905 m), weight (!) 313 lb (142 kg), SpO2 96 %.Body mass index is 39.12 kg/m.  General Appearance: Casual and Fairly Groomed  Eye Contact:  Good  Speech:  Clear and Coherent and Normal Rate  Volume:  Normal  Mood:  Euthymic  Affect:  Appropriate and Congruent  Thought Process:  Coherent and Goal Directed  Orientation:  Full (Time, Place, and Person)  Thought Content: WDL and Logical   Suicidal Thoughts:  No  Homicidal Thoughts:  No  Memory:  Immediate;   Good Recent;   Good  Judgement:  Good  Insight:  Good  Psychomotor Activity:  Normal  Concentration:  Concentration: Good and Attention Span: Good  Recall:  Good  Fund of Knowledge: Good  Language: Good  Akathisia:  Negative  Handed:  Right  AIMS (if indicated): not done  Assets:  Communication Skills Desire for Improvement Housing Resilience Social Support  ADL's:  Intact  Cognition: WNL  Sleep:  Good   Screenings: GAD-7    Advertising copywriter from 04/15/2022 in [facility] Counselor from 03/11/2022 in [facility] Counselor from 02/18/2022 in [facility] Counselor from 01/29/2022 in [facility] Counselor from 01/07/2022 in [facility]  Total GAD-7 Score 7 5 4 7 13       PHQ2-9    Flowsheet Row Counselor from 04/15/2022 in [facility] Counselor from 03/11/2022 in [facility] Counselor from 02/18/2022 in [facility] Counselor from 01/29/2022 in [facility] Counselor from 01/07/2022 in [facility]  PHQ-2 Total Score 2 2 3 3 3   PHQ-9 Total Score 8 7 7 10 12       Flowsheet  Row ED from 01/01/2022 in [facility] Office Visit from 11/12/2021 in [facility] Office Visit from 09/06/2021 in [facility]  C-SSRS RISK CATEGORY No Risk No Risk No Risk        Assessment and Plan:  Jahmeir L. Seehafer is a 37 yr old male who presents for follow up and medication management.  PPHx is significant for MDD, GAD, and Intermittent Explosive Disorder.     Jaquari continues to do well with his current regimen.  We will not make any changes to his medications at this time.  He will return for follow-up in approximately 3 months.   MDD, Recurrent  GAD: -Continue Prozac 20 mg daily for depression and anxiety. 30 tablets with 2 refills. -Continue Trazodone 50 mg QHS for sleep.  30 tablets with 2 refills.    Collaboration of Care: Collaboration of Care: Other provider involved in patient's care [facility] Therapist  Patient/Guardian was advised Release of Information must be obtained prior to any record release in order to collaborate their care with an outside provider. Patient/Guardian was advised if they have not already done so to contact the registration department to sign all necessary forms in order for 31 to release information regarding their care.   Consent: Patient/Guardian gives verbal consent for treatment and assignment of benefits for services provided during this visit. Patient/Guardian expressed understanding and agreed to proceed.    [name], MD 04/18/2022, 8:20 AM

## 2022-04-18 NOTE — Addendum Note (Signed)
Addended by: Briant Cedar on: 04/18/2022 10:36 AM   Modules accepted: Orders

## 2022-05-06 ENCOUNTER — Ambulatory Visit (HOSPITAL_COMMUNITY): Payer: Medicaid Other | Admitting: Licensed Clinical Social Worker

## 2022-05-27 ENCOUNTER — Ambulatory Visit (HOSPITAL_COMMUNITY): Payer: Medicaid Other | Admitting: Licensed Clinical Social Worker

## 2022-05-27 DIAGNOSIS — F331 Major depressive disorder, recurrent, moderate: Secondary | ICD-10-CM

## 2022-05-27 DIAGNOSIS — F411 Generalized anxiety disorder: Secondary | ICD-10-CM

## 2022-05-27 DIAGNOSIS — F6381 Intermittent explosive disorder: Secondary | ICD-10-CM

## 2022-05-27 NOTE — Progress Notes (Signed)
THERAPIST PROGRESS NOTE  Virtual Visit via Video Note  I connected with Nena Polio on 05/27/22 at 10:00 AM EST by a video enabled telemedicine application and verified that I am speaking with the correct person using two identifiers.  Location: Patient: [facility]  Provider: Providers Home    I discussed the limitations of evaluation and management by telemedicine and the availability of in person appointments. The patient expressed understanding and agreed to proceed.     I discussed the assessment and treatment plan with the patient. The patient was provided an opportunity to ask questions and all were answered. The patient agreed with the plan and demonstrated an understanding of the instructions.   The patient was advised to call back or seek an in-person evaluation if the symptoms worsen or if the condition fails to improve as anticipated.  I provided 30 minutes of non-face-to-face time during this encounter.   Dory Horn, LCSW  Participation Level: Active  Behavioral Response: CasualAlertAnxious  Type of Therapy: Individual Therapy  Treatment Goals addressed:  Active     Anxiety Disorder CCP Problem  1 GAD      STG: Patient will attend at least 80% of scheduled PHP sessions (Not Applicable)     Start:  08/30/21    Expected End:  02/27/22    Resolved:  11/05/21      STG: Patient will complete at least 80% of assigned homework (Progressing)     Start:  08/30/21    Expected End:  08/29/22         STG: Patient will practice problem solving skills 3 times per week for the next 4 weeks (Progressing)     Start:  08/30/21    Expected End:  08/29/22       Goal Note     Walking away from an argument.  Taking medication to help decrease anxiety and depression.            Depression CCP Problem  1 MDD      LTG: Yasser WILL SCORE LESS THAN 10 ON THE PATIENT HEALTH QUESTIONNAIRE (PHQ-9) (Completed/Met)     Start:  08/30/21    Expected End:   03/28/22    Resolved:  02/18/22      STG: Patsy Baltimore WILL ATTEND AT LEAST 80% OF SCHEDULED PHP SESSIONS (Not Applicable)     Start:  08/30/21    Expected End:  10/16/21    Resolved:  11/05/21      STG: Patsy Baltimore WILL PARTICIPATE IN AT LEAST 80% OF SCHEDULED INDIVIDUAL PSYCHOTHERAPY SESSIONS (Progressing)     Start:  08/30/21    Expected End:  08/29/22         STG: Alyus WILL COMPLETE AT LEAST 80% OF ASSIGNED HOMEWORK (Progressing)     Start:  08/30/21    Expected End:  08/29/22           ProgressTowards Goals: Progressing  Interventions: Motivational Interviewing and Supportive    Suicidal/Homicidal: Nowithout intent/plan  Therapist Response:   Pt was alert and oriented x 5. Rigsby was dressed casually and engaged well in therapy session. He presented with anxious mood/affect. He was dressed casually and maintained good eye contact.   Pt reports primary stressor is legal. He states that his court case got moved to [place]. He has received 1 year of probation. He has one more court date April 4th. He reports he needs paperwork filled out/completed by [facility] staff. LCSW told pt it takes 4  to 7 business day to complete any paperwork. Manual states that he needs it by March 6th. LCSW recommended that pt get the paperwork to [place] today or tomorrow. LCSW administered a PHQ-9. LCSW administered a GAD-7. PHQ-9 remained stable at an 8 and GAD-7 went from a 7 to a 3. LCSW used supportive therapy for praise and encouragement. LCCW spoke with pt about problem solving skills such as "Stop, think and listen technique". LCSW used motivational interview for open ended question and reflective listening.      Plan: Return again in 3 weeks.  Diagnosis: Moderate episode of recurrent major depressive disorder (HCC)  GAD (generalized anxiety disorder)  Intermittent explosive disorder in adult  Collaboration of Care: Other None today   Patient/Guardian was advised Release of  Information must be obtained prior to any record release in order to collaborate their care with an outside provider. Patient/Guardian was advised if they have not already done so to contact the registration department to sign all necessary forms in order for [place] to release information regarding their care.   Consent: Patient/Guardian gives verbal consent for treatment and assignment of benefits for services provided during this visit. Patient/Guardian expressed understanding and agreed to proceed.   Dory Horn, LCSW 05/27/2022

## 2022-05-28 ENCOUNTER — Telehealth (HOSPITAL_COMMUNITY): Payer: Self-pay | Admitting: Licensed Clinical Social Worker

## 2022-05-28 NOTE — Telephone Encounter (Signed)
Patient left letter from probation officer for Troy Cedar, Troy Phillips and Alvera Singh LCSW.  This letter requested  information regarding his diagnosis, medications, and frequency of being seen at [facility].  Letter was produced with information listed and signed by both Dr. Kai Levins and Alvera Singh LCSW. Letter is in pink folder at front desk. Pt notified via phone of letters completion and is ready for pickup.

## 2022-06-20 ENCOUNTER — Encounter (HOSPITAL_COMMUNITY): Payer: Self-pay | Admitting: Licensed Clinical Social Worker

## 2022-06-20 ENCOUNTER — Ambulatory Visit (INDEPENDENT_AMBULATORY_CARE_PROVIDER_SITE_OTHER): Payer: Medicaid Other | Admitting: Licensed Clinical Social Worker

## 2022-06-20 DIAGNOSIS — F331 Major depressive disorder, recurrent, moderate: Secondary | ICD-10-CM

## 2022-06-20 DIAGNOSIS — F6381 Intermittent explosive disorder: Secondary | ICD-10-CM

## 2022-06-20 DIAGNOSIS — F411 Generalized anxiety disorder: Secondary | ICD-10-CM

## 2022-06-20 NOTE — Progress Notes (Signed)
THERAPIST PROGRESS NOTE  Virtual Visit via Video Note  I connected with Nena Polio on 06/20/22 at 11:00 AM EDT by a video enabled telemedicine application and verified that I am speaking with the correct person using two identifiers.  Location: Patient: [facility]  Provider: Providers Home    I discussed the limitations of evaluation and management by telemedicine and the availability of in person appointments. The patient expressed understanding and agreed to proceed.    I discussed the assessment and treatment plan with the patient. The patient was provided an opportunity to ask questions and all were answered. The patient agreed with the plan and demonstrated an understanding of the instructions.   The patient was advised to call back or seek an in-person evaluation if the symptoms worsen or if the condition fails to improve as anticipated.  I provided 30 minutes of non-face-to-face time during this encounter.   Dory Horn, LCSW   Participation Level: Active  Behavioral Response: CasualAlertDepressed and Hopeless  Type of Therapy: Individual Therapy  Treatment Goals addressed:  Active     Anxiety Disorder CCP Problem  1 GAD      STG: Patient will attend at least 80% of scheduled PHP sessions (Not Applicable)     Start:  08/30/21    Expected End:  02/27/22    Resolved:  11/05/21      STG: Patient will complete at least 80% of assigned homework (Progressing)     Start:  08/30/21    Expected End:  08/29/22         STG: Patient will practice problem solving skills 3 times per week for the next 4 weeks (Progressing)     Start:  08/30/21    Expected End:  08/29/22           Depression CCP Problem  1 MDD      LTG: Makiah WILL SCORE LESS THAN 10 ON THE PATIENT HEALTH QUESTIONNAIRE (PHQ-9) (Completed/Met)     Start:  08/30/21    Expected End:  03/28/22    Resolved:  02/18/22      STG: Patsy Baltimore WILL ATTEND AT LEAST 80% OF SCHEDULED PHP SESSIONS  (Not Applicable)     Start:  08/30/21    Expected End:  10/16/21    Resolved:  11/05/21      STG: Gibbs WILL PARTICIPATE IN AT LEAST 80% OF SCHEDULED INDIVIDUAL PSYCHOTHERAPY SESSIONS (Progressing)     Start:  08/30/21    Expected End:  08/29/22         STG: Dionisio WILL COMPLETE AT LEAST 80% OF ASSIGNED HOMEWORK (Progressing)     Start:  08/30/21    Expected End:  08/29/22             ProgressTowards Goals: Progressing  Interventions: CBT and Motivational Interviewing   Suicidal/Homicidal: Nowithout intent/plan  Therapist Response:   Pt was alert and oriented x 5. He was dressed casually and engaged well in therapy session. Keigo was pleasant, cooperative and maintained good eye contact. He presented with euthymic mood/affect.   Pt comes in today stating that "everything has been going well". Legal problems are starting to sort themselves out. 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 Pt reports increased time with the family attempting to build a better relationship with his children. Pt reports that he is also attempting to get his GED and will start in April with his classes.  Intervention/Plan: LCSW administered a PHQ-9. LCSW administer a GAD-7. Pt has met goals for bother AEB treatment plan updated above. LCSW used supportive therapy for praise and encouragements. LCSW used psychoanalytic therapy for pt to express thoughts, feelings, and emotions.    Plan: Return again in 3 weeks.  Diagnosis: Moderate episode of recurrent major depressive disorder (HCC)  Intermittent explosive disorder in adult  GAD (generalized anxiety disorder)  Collaboration of Care: Other None today   Patient/Guardian was advised Release of Information must be obtained prior to any record release in order to collaborate their care with an outside provider. Patient/Guardian was advised if they have not already done so to  contact the registration department to sign all necessary forms in order for [place] to release information regarding their care.   Consent: Patient/Guardian gives verbal consent for treatment and assignment of benefits for services provided during this visit. Patient/Guardian expressed understanding and agreed to proceed.   Dory Horn, LCSW 06/20/2022

## 2022-07-14 ENCOUNTER — Encounter (HOSPITAL_COMMUNITY): Payer: Self-pay | Admitting: Licensed Clinical Social Worker

## 2022-07-14 ENCOUNTER — Ambulatory Visit (INDEPENDENT_AMBULATORY_CARE_PROVIDER_SITE_OTHER): Payer: Medicaid Other | Admitting: Licensed Clinical Social Worker

## 2022-07-14 DIAGNOSIS — F32A Depression, unspecified: Secondary | ICD-10-CM | POA: Diagnosis not present

## 2022-07-14 DIAGNOSIS — F411 Generalized anxiety disorder: Secondary | ICD-10-CM

## 2022-07-14 DIAGNOSIS — F6381 Intermittent explosive disorder: Secondary | ICD-10-CM

## 2022-07-14 DIAGNOSIS — F3340 Major depressive disorder, recurrent, in remission, unspecified: Secondary | ICD-10-CM

## 2022-07-14 NOTE — Progress Notes (Signed)
THERAPIST PROGRESS NOTE  Session Time: 30  Participation Level: Active  Behavioral Response: CasualAlertAnxious and Depressed  Type of Therapy: Individual Therapy  Treatment Goals addressed:  Active     Anxiety Disorder CCP Problem  1 GAD      STG: Patient will attend at least 80% of scheduled PHP sessions (Not Applicable)     Start:  08/30/21    Expected End:  02/27/22    Resolved:  11/05/21      STG: Patient will complete at least 80% of assigned homework (Progressing)     Start:  08/30/21    Expected End:  08/29/22         STG: Patient will practice problem solving skills 3 times per week for the next 4 weeks (Progressing)     Start:  08/30/21    Expected End:  08/29/22           Depression CCP Problem  1 MDD      LTG: Troy Phillips WILL SCORE LESS THAN 10 ON THE PATIENT HEALTH QUESTIONNAIRE (PHQ-9) (Completed/Met)     Start:  08/30/21    Expected End:  03/28/22    Resolved:  02/18/22      STG: Troy Phillips Goes WILL ATTEND AT LEAST 80% OF SCHEDULED PHP SESSIONS (Not Applicable)     Start:  08/30/21    Expected End:  10/16/21    Resolved:  11/05/21      STG: Troy Phillips WILL PARTICIPATE IN AT LEAST 80% OF SCHEDULED INDIVIDUAL PSYCHOTHERAPY SESSIONS (Progressing)     Start:  08/30/21    Expected End:  08/29/22         STG: Troy Phillips WILL COMPLETE AT LEAST 80% OF ASSIGNED HOMEWORK (Progressing)     Start:  08/30/21    Expected End:  08/29/22            ProgressTowards Goals: Progressing  Interventions: Motivational Interviewing and Supportive  Summary: Troy Phillips is a 37 y.o. male who presents with depressed and anxious mood\affect.  Patient was pleasant, cooperative, maintained good eye contact.  He engaged well in therapy session was dressed casually.  Patient reports everything has been going "well".  He reports that he has been spending most of his time with his daughter and focusing on himself.  Patient reports that the court case has now been closed and he has 1  year probation that will end in January 2025.  Patient reports that he now has contact with his wife as the restraining order was dropped.  Patient reports he has no ambition to get up with his wife again but wants to think to keep things cordial.  Suicidal/Homicidal: Nowithout intent/plan  Therapist Response:    Intervention/plan: LCSW psychoanalytic therapy for patient to express thoughts, feelings and emotions.  LCSW administered GAD-7.  LCSW administered a PHQ-9.  LCSW notes both PHQ-9 and GAD-7 is at a 5 or less.  Patient continues to utilize problem-solving skills such as "stop, think, listen".  He is also using spending quality time with his family to help decrease his stress and tension.  LCSW educated patient on taking medications as prescribed.  Patient reports taking medication 7 out of 7 days/week.   Plan: Return again in 3 weeks.  Diagnosis: No diagnosis found.  Collaboration of Care: Other None today   Patient/Guardian was advised Release of Information must be obtained prior to any record release in order to collaborate their care with an outside provider. Patient/Guardian was advised if they have not already  done so to contact the registration department to sign all necessary forms [place] for us to release information regarding their care.   Consent: Patient/Guardian gives verbal consent for treatment and assignment of benefits for services provided during this visit. Patient/Guardian expressed understanding and agreed to proceed.   [name], LCSW 07/14/2022

## 2022-07-18 ENCOUNTER — Encounter (HOSPITAL_COMMUNITY): Payer: Medicaid Other | Admitting: Student in an Organized Health Care Education/Training Program

## 2022-07-18 NOTE — Progress Notes (Deleted)
BH MD/PA/NP OP Progress Note  07/18/2022 7:50 AM Troy Phillips  MRN:  [id number]  Chief Complaint: No chief complaint on file.  HPI:  Troy Phillips is a 37 yr old male who presents for follow up and medication management. PPHx is significant for MDD, GAD, and Intermittent Explosive Disorder.   ***   Visit Diagnosis: No diagnosis found.  Past Psychiatric History: MDD, GAD, and Intermittent Explosive Disorder.   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  Family Psychiatric History: Father - patient is unsure of psychiatric diagnoses but states that his father was on many medications   Family History:  Family History  Problem Relation Age of Onset   Heart attack Father    Heart disease Maternal Grandmother    Hypertension Other     Social History:  Social History   Socioeconomic History   Marital status: Single    Spouse name: Not on file   Number of children: Not on file   Years of education: Not on file   Highest education level: Not on file  Occupational History   Not on file  Tobacco Use   Smoking status: Every Day    Packs/day: .5    Types: Cigarettes   Smokeless tobacco: Never  Vaping Use   Vaping Use: Never used  Substance and Sexual Activity   Alcohol use: Not Currently   Drug use: Not Currently    Types: Marijuana    Comment: Has not smoked since March 1st 2024 current date is March 22nd 2024   Sexual activity: Not Currently    Partners: Female  Other Topics Concern   Not on file  Social History Narrative   Not on file   Social Determinants of Health   Financial Resource Strain: Low Risk  (08/30/2021)   Overall Financial Resource Strain (CARDIA)    Difficulty of Paying Living Expenses: Not hard at all  Food Insecurity: No Food Insecurity (08/30/2021)   Hunger Vital Sign    Worried About Running Out of  Food in the Last Year: Never true    Ran Out of Food in the Last Year: Never true  Transportation Needs: No Transportation Needs (08/30/2021)   PRAPARE - Administrator, Civil Service (Medical): No    Lack of Transportation (Non-Medical): No  Physical Activity: Sufficiently Active (08/30/2021)   Exercise Vital Sign    Days of Exercise per Week: 7 days    Minutes of Exercise per Session: 60 min  Stress: No Stress Concern Present (06/20/2022)   [employer] of Occupational Health - Occupational Stress Questionnaire    Feeling of Stress : Not at all  Social Connections: Moderately Isolated (07/14/2022)   Social Connection and Isolation Panel [NHANES]    Frequency of Communication with Friends and Family: More than three times a week    Frequency of Social Gatherings with Friends and Family: Twice a week    Attends Religious Services: 1 to 4 times per year    Active Member of [employer] or Organizations: No    Attends Engineer, structural: Never    Marital Status: Separated    Allergies: No Known Allergies  Metabolic Disorder Labs: No results found for: "HGBA1C", "MPG" No results found for: "PROLACTIN" No results found for: "CHOL", "TRIG", "  HDL", "CHOLHDL", "VLDL", "LDLCALC" No results found for: "TSH"  Therapeutic Level Labs: No results found for: "LITHIUM" No results found for: "VALPROATE" No results found for: "CBMZ"  Current Medications: Current Outpatient Medications  Medication Sig Dispense Refill   albuterol (PROVENTIL HFA;VENTOLIN HFA) 108 (90 Base) MCG/ACT inhaler Inhale 2 puffs into the lungs every 6 (six) hours as needed for wheezing or shortness of breath.     diphenhydrAMINE (BENADRYL) 25 MG tablet Take 1 tablet (25 mg total) by mouth every 6 (six) hours as needed. (Patient taking differently: Take 25 mg by mouth every 6 (six) hours as needed for itching. ) 30 tablet 0   FLUoxetine (PROZAC) 20 MG capsule Take 1 capsule (20 mg total) by mouth daily. 30  capsule 2   ibuprofen (ADVIL,MOTRIN) 800 MG tablet Take 1 tablet (800 mg total) by mouth 3 (three) times daily. (Patient not taking: Reported on 02/20/2020) 21 tablet 0   ondansetron (ZOFRAN ODT) 4 MG disintegrating tablet Take 1 tablet (4 mg total) by mouth every 8 (eight) hours as needed for nausea or vomiting. (Patient not taking: Reported on 02/20/2020) 20 tablet 0   traZODone (DESYREL) 50 MG tablet Take 1 tablet (50 mg total) by mouth at bedtime as needed for sleep. If sleep issues continue to occur, patient to take an additional tablet as needed. 30 tablet 2   No current facility-administered medications for this visit.     Musculoskeletal: Strength & Muscle Tone: {desc; muscle tone:32375} Gait & Station: {PE GAIT ED NWGN:56213} Patient leans: {Patient Leans:21022755}  Psychiatric Specialty Exam: Review of Systems  There were no vitals taken for this visit.There is no height or weight on file to calculate BMI.  General Appearance: {Appearance:22683}  Eye Contact:  {BHH EYE CONTACT:22684}  Speech:  {Speech:22685}  Volume:  {Volume (PAA):22686}  Mood:  {BHH MOOD:22306}  Affect:  {Affect (PAA):22687}  Thought Process:  {Thought Process (PAA):22688}  Orientation:  {BHH ORIENTATION (PAA):22689}  Thought Content: {Thought Content:22690}   Suicidal Thoughts:  {ST/HT (PAA):22692}  Homicidal Thoughts:  {ST/HT (PAA):22692}  Memory:  {BHH MEMORY:22881}  Judgement:  {Judgement (PAA):22694}  Insight:  {Insight (PAA):22695}  Psychomotor Activity:  {Psychomotor (PAA):22696}  Concentration:  {Concentration:21399}  Recall:  {BHH GOOD/FAIR/POOR:22877}  Fund of Knowledge: {BHH GOOD/FAIR/POOR:22877}  Language: {BHH GOOD/FAIR/POOR:22877}  Akathisia:  {BHH YES OR NO:22294}  Handed:  Right  AIMS (if indicated): {Desc; done/not:10129}  Assets:  {Assets (PAA):22698}  ADL's:  {BHH YQM'V:78469}  Cognition: {chl bhh cognition:304700322}  Sleep:  {BHH GOOD/FAIR/POOR:22877}   Screenings: GAD-7     Advertising copywriter from 07/14/2022 in [facility] Counselor from 06/20/2022 in [facility] Counselor from 05/27/2022 in [facility] Counselor from 04/15/2022 in [facility] Counselor from 03/11/2022 in [facility]  Total GAD-7 Score 3 4 3 7 5       [phone]    Flowsheet Row Counselor from 07/14/2022 in [facility] Counselor from 06/20/2022 in [facility] Counselor from 05/27/2022 in [facility] Counselor from 04/15/2022 in [facility] Counselor from 03/11/2022 in [facility]  PHQ-2 Total Score 1 2 1 2 2   PHQ-9 Total Score 5 3 8 8 7       Flowsheet Row ED from 01/01/2022 in [facility] Department at [facility] Office Visit from 11/12/2021 in [facility] Office Visit from 09/06/2021 in  [facility]  C-SSRS RISK CATEGORY No Risk No Risk No Risk        Assessment and Plan:  Troy Phillips is a 37 yr old male who presents for follow up and medication management. PPHx is significant for MDD, GAD, and Intermittent Explosive Disorder.    ***   MDD, Recurrent  GAD: -Continue Prozac 20 mg daily for depression and anxiety. 30 tablets with 2 refills. -Continue Trazodone 50 mg QHS for sleep.  30 tablets with 2 refills.   Collaboration of Care: Collaboration of Care: Other provider involved in patient's care AEB [facility] Therapist  Patient/Guardian was advised Release of Information must be obtained prior to any record release in order to collaborate their care with an outside provider. Patient/Guardian was advised if they have not already done so to contact the registration department to sign all necessary forms in order for [place] to release  information regarding their care.   Consent: Patient/Guardian gives verbal consent for treatment and assignment of benefits for services provided during this visit. Patient/Guardian expressed understanding and agreed to proceed.    Lauro Franklin, MD 07/18/2022, 7:50 AM

## 2022-07-21 ENCOUNTER — Encounter (HOSPITAL_COMMUNITY): Payer: Medicaid Other | Admitting: Student in an Organized Health Care Education/Training Program

## 2022-08-11 ENCOUNTER — Ambulatory Visit (INDEPENDENT_AMBULATORY_CARE_PROVIDER_SITE_OTHER): Payer: Medicaid Other | Admitting: Licensed Clinical Social Worker

## 2022-08-11 DIAGNOSIS — F331 Major depressive disorder, recurrent, moderate: Secondary | ICD-10-CM

## 2022-08-11 DIAGNOSIS — F6381 Intermittent explosive disorder: Secondary | ICD-10-CM

## 2022-08-11 DIAGNOSIS — F411 Generalized anxiety disorder: Secondary | ICD-10-CM

## 2022-08-11 NOTE — Progress Notes (Signed)
THERAPIST PROGRESS NOTE  Virtual Visit via Video Note  I connected with Troy Phillips on 08/11/22 at  1:00 PM EDT by a video enabled telemedicine application and verified that I am speaking with the correct person using two identifiers.  Location: Patient: [facility]  Provider: Provider Home    I discussed the limitations of evaluation and management by telemedicine and the availability of in person appointments. The patient expressed understanding and agreed to proceed.      I discussed the assessment and treatment plan with the patient. The patient was provided an opportunity to ask questions and all were answered. The patient agreed with the plan and demonstrated an understanding of the instructions.   The patient was advised to call back or seek an in-person evaluation if the symptoms worsen or if the condition fails to improve as anticipated.  I provided 30 minutes of non-face-to-face time during this encounter.   Troy Cooks, LCSW   Participation Level: Active  Behavioral Response: CasualAlertAnxious and Depressed  Type of Therapy: Individual Therapy  Treatment Goals addressed:  Active     Anxiety Disorder CCP Problem  1 GAD      STG: Patient will attend at least 80% of scheduled PHP sessions (Not Applicable)     Start:  08/30/21    Expected End:  02/27/22    Resolved:  11/05/21      STG: Patient will complete at least 80% of assigned homework (Progressing)     Start:  08/30/21    Expected End:  08/29/22         STG: Patient will practice problem solving skills 3 times per week for the next 4 weeks (Progressing)     Start:  08/30/21    Expected End:  08/29/22           Depression CCP Problem  1 MDD      LTG: Troy Phillips WILL SCORE LESS THAN 10 ON THE PATIENT HEALTH QUESTIONNAIRE (PHQ-9) (Completed/Met)     Start:  08/30/21    Expected End:  03/28/22    Resolved:  02/18/22      STG: Troy Phillips WILL ATTEND AT LEAST 80% OF SCHEDULED PHP SESSIONS  (Not Applicable)     Start:  08/30/21    Expected End:  10/16/21    Resolved:  11/05/21      STG: Troy Phillips WILL PARTICIPATE IN AT LEAST 80% OF SCHEDULED INDIVIDUAL PSYCHOTHERAPY SESSIONS (Progressing)     Start:  08/30/21    Expected End:  08/29/22         STG: Troy Phillips WILL COMPLETE AT LEAST 80% OF ASSIGNED HOMEWORK (Progressing)     Start:  08/30/21    Expected End:  08/29/22            ProgressTowards Goals: Progressing  Interventions: CBT and Motivational Interviewing  Suicidal/Homicidal: Nowithout intent/plan  Therapist Response:   Pt was alert and oriented x 5. He was dressed casually and engaged well in therapy session. Patient was pleasant, cooperative, and maintain good eye contact. He presented today with flat and anxious mood\ affect.  Patient reports primary stressors as ex relationship. He reports that he is starting to talk to his ex-wife again. He reports this is due to wanting to have a relationship with the kids for both him and her. Patient reports that things are starting out slow where they met at a park. Patient reports decreased anxiety for attention and worry. Patient reports decrease for worthlessness and hopelessness. Patient reports  this is because he is engaging with his ex-partner again and starting classes for his GED. Patient reports getting a child at that time and staying within the guidelines Social Security disability.   Interventions/Plan: LCSW administered a PHQ-9. LCSW administered a GAD-7. LCSW reviewed scores with pt. LCSW educated pt on "Stop, think and listen" technique.  LCSW spoke with pt about internalization vs externalization of emotions. LCSW used psychoanalytic therapy for pt to express thoughts, feeling and concerns.   Plan: Return again in 3 weeks.  Diagnosis: Moderate episode of recurrent major depressive disorder (HCC)  GAD (generalized anxiety disorder)  Intermittent explosive disorder in adult  Collaboration of Care: Other None  today   Patient/Guardian was advised Release of Information must be obtained prior to any record release in order to collaborate their care with an outside provider. Patient/Guardian was advised if they have not already done so to contact the registration department to sign all necessary forms in order for [place] to release information regarding their care.   Consent: Patient/Guardian gives verbal consent for treatment and assignment of benefits for services provided during this visit. Patient/Guardian expressed understanding and agreed to proceed.   Troy Cooks, LCSW 08/11/2022

## 2022-09-08 ENCOUNTER — Ambulatory Visit (HOSPITAL_COMMUNITY): Payer: Medicaid Other | Admitting: Licensed Clinical Social Worker

## 2022-09-08 DIAGNOSIS — F6381 Intermittent explosive disorder: Secondary | ICD-10-CM

## 2022-09-08 NOTE — Progress Notes (Signed)
THERAPIST PROGRESS NOTE Virtual Visit via Video Note  I connected with Troy Phillips on 09/08/22 at  1:00 PM EDT by a video enabled telemedicine application and verified that I am speaking with the correct person using two identifiers.  Location: Patient: Troy Phillips  Provider: [facility]iders Home    I discussed the limitations of evaluation and management by telemedicine and the availability of in person appointments. The patient expressed understanding and agreed to proceed.     I discussed the assessment and treatment plan with the patient. The patient was provided an opportunity to ask questions and all were answered. The patient agreed with the plan and demonstrated an understanding of the instructions.   The patient was advised to call back or seek an in-person evaluation if the symptoms worsen or if the condition fails to improve as anticipated.  I provided 20 minutes of non-face-to-face time during this encounter.   [name], LCSW   Participation Level: Active  Behavioral Response: CasualAlertEuthymic  Type of Therapy: Individual Therapy  Treatment Goals addressed:  Active     Anxiety Disorder CCP Problem  1 GAD      STG: Patient will attend at least 80% of scheduled PHP sessions (Not Applicable)     Start:  08/30/21    Expected End:  02/27/22    Resolved:  11/05/21      STG: Patient will complete at least 80% of assigned homework (Completed/Met)     Start:  08/30/21    Expected End:  11/28/22    Resolved:  09/08/22    Goal Note     Pt has no missed session to date in 1 year of coming to therapy          STG: Patient will practice problem solving skills 3 times per week for the next 4 weeks (Progressing)     Start:  08/30/21    Expected End:  11/28/22           Depression CCP Problem  1 MDD      LTG: Troy Phillips WILL SCORE LESS THAN 10 ON THE PATIENT HEALTH QUESTIONNAIRE (PHQ-9) (Completed/Met)     Start:  08/30/21    Expected End:  03/28/22     Resolved:  02/18/22      STG: Troy Phillips WILL ATTEND AT LEAST 80% OF SCHEDULED PHP SESSIONS (Not Applicable)     Start:  08/30/21    Expected End:  10/16/21    Resolved:  11/05/21      STG: Troy Phillips WILL PARTICIPATE IN AT LEAST 80% OF SCHEDULED INDIVIDUAL PSYCHOTHERAPY SESSIONS (Completed/Met)     Start:  08/30/21    Expected End:  11/28/22    Resolved:  09/08/22    Goal Note     Pt has no missed session to date in 1 year of coming to therapy          STG: Troy Phillips WILL COMPLETE AT LEAST 80% OF ASSIGNED HOMEWORK (Progressing)     Start:  08/30/21    Expected End:  11/28/22            ProgressTowards Goals: Progressing  Interventions: CBT, Motivational Interviewing, and Supportive    Suicidal/Homicidal: Nowithout intent/plan  Therapist Response:    Pt alert and oriented x 5. He was dressed casually and engaged well in therapy session. He presented with euthymic mood /affect. Troy Phillips was pleasant, cooperative and maintained good eye contact.   Pt reports decrease irritability, worthlessness, and hopelessness. Troy Phillips reports taking medications 7/7  days per week. LCSW used solution focused therapy to get pt rescheduled with medication team as he missed his last appointment. Pt reschedule for July 19th. LCSW administered a PHQ-9. LCSW administered a GAD-7. LCSW reviewed scores with pt. Pt scoring below 5 for both depression and anxiety scale. Pt reports utilizing coping skills for anger such as "Stop, think, listen", exercising/walking, taking medications, and spending time with his family.  LCSW spoke with pt about discharge from therapy and pt want 2 more appointment to make sure anger is under control but was agreeable to discharge as goals have been progressing or met.     Plan: Return again in 3 weeks.  Diagnosis: Intermittent explosive disorder in adult  Collaboration of Care: Other None today   Patient/Guardian was advised Release of Information must be obtained prior  to any record release in order to collaborate their care with an outside provider. Patient/Guardian was advised if they have not already done so to contact the registration department to sign all necessary forms in order for [place] to release information regarding their care.   Consent: Patient/Guardian gives verbal consent for treatment and assignment of benefits for services provided during this visit. Patient/Guardian expressed understanding and agreed to proceed.   [name], LCSW 09/08/2022

## 2022-09-29 ENCOUNTER — Telehealth (HOSPITAL_COMMUNITY): Payer: Self-pay | Admitting: Licensed Clinical Social Worker

## 2022-09-29 ENCOUNTER — Ambulatory Visit (HOSPITAL_COMMUNITY): Payer: Medicaid Other | Admitting: Licensed Clinical Social Worker

## 2022-09-29 ENCOUNTER — Encounter (HOSPITAL_COMMUNITY): Payer: Self-pay

## 2022-09-29 NOTE — Telephone Encounter (Signed)
LCSW sent two links to pt phone with no response for 1400 appointment. LCSW f/u with PC at 1412 and left HIPAA compliant VM. LCSW waited until 1418 before disconnecting

## 2022-10-17 ENCOUNTER — Telehealth (INDEPENDENT_AMBULATORY_CARE_PROVIDER_SITE_OTHER): Payer: Medicaid Other | Admitting: Student in an Organized Health Care Education/Training Program

## 2022-10-17 ENCOUNTER — Encounter (HOSPITAL_COMMUNITY): Payer: Self-pay | Admitting: Student in an Organized Health Care Education/Training Program

## 2022-10-17 DIAGNOSIS — F411 Generalized anxiety disorder: Secondary | ICD-10-CM | POA: Diagnosis not present

## 2022-10-17 DIAGNOSIS — F331 Major depressive disorder, recurrent, moderate: Secondary | ICD-10-CM | POA: Diagnosis not present

## 2022-10-17 MED ORDER — TRAZODONE HCL 50 MG PO TABS: 50.0000 mg | ORAL_TABLET | Freq: Every day | ORAL | 2 refills | Status: DC

## 2022-10-17 MED ORDER — FLUOXETINE HCL 20 MG PO CAPS: 20.0000 mg | ORAL_CAPSULE | Freq: Every day | ORAL | 2 refills | Status: DC

## 2022-10-17 NOTE — Progress Notes (Signed)
BH MD/PA/NP OP Progress Note  Virtual Visit via Video Note  I connected with Troy Phillips on 10/17/22 at 11:30 AM EDT by a video enabled telemedicine application and verified that I am speaking with the correct person using two identifiers.  Location: Patient: Home Provider: [facility]   I discussed the limitations of evaluation and management by telemedicine and the availability of in person appointments. The patient expressed understanding and agreed to proceed.   10/17/2022 11:42 AM Troy Phillips  MRN:  [id number]  Chief Complaint:  Chief Complaint  Patient presents with   Follow-up   Depression   Anxiety   HPI:  Troy Phillips is a 37 yr old male who presents for follow up and medication management. PPHx is significant for MDD, GAD, and Intermittent Explosive Disorder.    He reports that he has been doing good since his last appointment.  He reports that he has been enjoying his time with the kids since they are out for summer.  He reports that he has been sleeping a lot better and that because of this he thinking before he acts and things have been going much better.  He reports that he has been talking again with his wife and it has been going good.  He reports no side effects to his medications.  Discussed not making any changes to his medications at this time and he was agreeable.  He reports no SI, HI, or AVH.  He reports his sleep is good. He reports his appetite is good.  He reports no other concerns at present.  He will return for follow up in approximately 3 months.    Visit Diagnosis:    ICD-10-CM   1. GAD (generalized anxiety disorder)  F41.1 FLUoxetine (PROZAC) 20 MG capsule    2. Moderate episode of recurrent major depressive disorder (HCC)  F33.1 FLUoxetine (PROZAC) 20 MG capsule    traZODone (DESYREL) 50 MG tablet      Past Psychiatric History: MDD, GAD, and Intermittent Explosive Disorder.   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  Family Psychiatric History: Father - patient is unsure of psychiatric diagnoses but states that his father was on many medications   Family History:  Family History  Problem Relation Age of Onset   Heart attack Father    Heart disease Maternal Grandmother    Hypertension Other     Social History:  Social History   Socioeconomic History   Marital status: Single    Spouse name: Not on file   Number of children: Not on file   Years of education: Not on file   Highest education level: Not on file  Occupational History   Not on file  Tobacco Use   Smoking status: Every Day    Current packs/day: 0.50    Types: Cigarettes   Smokeless tobacco: Never  Vaping Use   Vaping status: Never Used  Substance and Sexual Activity   Alcohol use: Not Currently   Drug use: Not Currently    Types: Marijuana    Comment: Has not smoked since March 1st 2024 current date is March 22nd 2024   Sexual activity: Not Currently    Partners: Female  Other Topics Concern   Not on file  Social History  Narrative   Not on file   Social Determinants of Health   Financial Resource Strain: Low Risk  (08/30/2021)   Overall Financial Resource Strain (CARDIA)    Difficulty of Paying Living Expenses: Not hard at all  Food Insecurity: No Food Insecurity (08/30/2021)   Hunger Vital Sign    Worried About Running Out of Food in the Last Year: Never true    Ran Out of Food in the Last Year: Never true  Transportation Needs: No Transportation Needs (08/30/2021)   PRAPARE - Administrator, Civil Service (Medical): No    Lack of Transportation (Non-Medical): No  Physical Activity: Sufficiently Active (08/30/2021)   Exercise Vital Sign    Days of Exercise per Week: 7 days    Minutes of Exercise per Session: 60 min  Stress: No Stress Concern Present (06/20/2022)   [employer] of Occupational Health - Occupational Stress Questionnaire    Feeling of Stress : Not at all  Social Connections: Moderately Isolated (07/14/2022)   Social Connection and Isolation Panel [NHANES]    Frequency of Communication with Friends and Family: More than three times a week    Frequency of Social Gatherings with Friends and Family: Twice a week    Attends Religious Services: 1 to 4 times per year    Active Member of [employer] or Organizations: No    Attends Engineer, structural: Never    Marital Status: Separated    Allergies: No Known Allergies  Metabolic Disorder Labs: No results found for: "HGBA1C", "MPG" No results found for: "PROLACTIN" No results found for: "CHOL", "TRIG", "HDL", "CHOLHDL", "VLDL", "LDLCALC" No results found for: "TSH"  Therapeutic Level Labs: No results found for: "LITHIUM" No results found for: "VALPROATE" No results found for: "CBMZ"  Current Medications: Current Outpatient Medications  Medication Sig Dispense Refill   albuterol (PROVENTIL HFA;VENTOLIN HFA) 108 (90 Base) MCG/ACT inhaler Inhale 2 puffs into the lungs every 6 (six) hours as needed for wheezing or shortness of breath.     diphenhydrAMINE (BENADRYL) 25 MG tablet Take 1 tablet (25 mg total) by mouth every 6 (six) hours as needed. (Patient taking differently: Take 25 mg by mouth every 6 (six) hours as needed for itching. ) 30 tablet 0   FLUoxetine (PROZAC) 20 MG capsule Take 1 capsule (20 mg total) by mouth daily. 30 capsule 2   ibuprofen (ADVIL,MOTRIN) 800 MG tablet Take 1 tablet (800 mg total) by mouth 3 (three) times daily. (Patient not taking: Reported on 02/20/2020) 21 tablet 0   ondansetron (ZOFRAN ODT) 4 MG disintegrating tablet Take 1 tablet (4 mg total) by mouth every 8 (eight) hours as needed for nausea or vomiting. (Patient not taking: Reported on 02/20/2020) 20 tablet 0   traZODone (DESYREL) 50 MG tablet Take 1 tablet (50 mg total) by mouth at bedtime. If sleep  issues continue to occur, patient to take an additional tablet as needed. 30 tablet 2   No current facility-administered medications for this visit.     Musculoskeletal: Strength & Muscle Tone: within normal limits Gait & Station: normal Patient leans: N/A  Psychiatric Specialty Exam: Review of Systems  Respiratory:  Negative for cough and shortness of breath.   Cardiovascular:  Negative for chest pain.  Gastrointestinal:  Negative for abdominal pain, constipation, diarrhea, nausea and vomiting.  Neurological:  Negative for weakness and headaches.  Psychiatric/Behavioral:  Negative for dysphoric mood, hallucinations, sleep disturbance and suicidal ideas. The patient is not nervous/anxious.  There were no vitals taken for this visit.There is no height or weight on file to calculate BMI.  General Appearance: Casual and Fairly Groomed  Eye Contact:  Good  Speech:  Clear and Coherent and Normal Rate  Volume:  Normal  Mood:  Euthymic  Affect:  Appropriate and Congruent  Thought Process:  Coherent and Goal Directed  Orientation:  Full (Time, Place, and Person)  Thought Content: WDL and Logical   Suicidal Thoughts:  No  Homicidal Thoughts:  No  Memory:  Immediate;   Good Recent;   Good  Judgement:  Good  Insight:  Good  Psychomotor Activity:  Normal  Concentration:  Concentration: Good and Attention Span: Good  Recall:  Good  Fund of Knowledge: Good  Language: Good  Akathisia:  Negative  Handed:  Right  AIMS (if indicated): not done  Assets:  Communication Skills Desire for Improvement Housing Resilience Social Support  ADL's:  Intact  Cognition: WNL  Sleep:  Good   Screenings: GAD-7    Advertising copywriter from 09/08/2022 in [facility] Counselor from 08/11/2022 in [facility] Counselor from 07/14/2022 in [facility] Counselor from 06/20/2022 in [facility] Counselor from 05/27/2022 in [facility]  Total GAD-7 Score 3 3 3 4 3       PHQ2-9    Flowsheet Row Counselor from 09/08/2022 in [facility] Counselor from 08/11/2022 in [facility] Counselor from 07/14/2022 in [facility] Counselor from 06/20/2022 in [facility] Counselor from 05/27/2022 in [place]  PHQ-2 Total Score 1 2 1 2 1   PHQ-9 Total Score 4 6 5 3 8       Flowsheet Row ED from 01/01/2022 in [facility] at [facility] Office Visit from 11/12/2021 in [facility] Office Visit from 09/06/2021 in [facility]  C-SSRS RISK CATEGORY No Risk No Risk No Risk        Assessment and Plan:  Troy Phillips is a 37 yr old male who presents for follow up and medication management.  PPHx is significant for MDD, GAD, and Intermittent Explosive Disorder.     Troy Phillips has been doing well on his medications.  We will not make any changes to his medications at this time.  Refills were sent in.  He will return for follow up in approximately 3 months.   MDD, Recurrent  GAD: -Continue Prozac 20 mg daily for depression and anxiety. 30 tablets with 2 refills. -Continue Trazodone 50 mg QHS for sleep.  30 tablets with 2 refills.    Collaboration of Care: Collaboration of Care: Other provider involved in patient's care [facility] Therapist  Patient/Guardian was advised Release of Information must be obtained prior to any record release in order to collaborate their care with an outside provider. Patient/Guardian was advised if they have not already done so to contact the registration department to sign all necessary forms in order for [place] to release information regarding their care.   Consent: Patient/Guardian gives verbal consent for treatment and  assignment of benefits for services provided during this visit. Patient/Guardian expressed understanding and agreed to proceed.    Lauro Franklin, MD 10/17/2022, 11:42 AM  Follow Up Instructions:    I discussed the assessment and treatment plan with the patient. The patient was provided an opportunity  to ask questions and all were answered. The patient agreed with the plan and demonstrated an understanding of the instructions.   The patient was advised to call back or seek an in-person evaluation if the symptoms worsen or if the condition fails to improve as anticipated.  I provided 5 minutes of non-face-to-face time during this encounter.   Lauro Franklin, MD

## 2022-10-30 ENCOUNTER — Ambulatory Visit (HOSPITAL_COMMUNITY): Payer: Medicaid Other | Admitting: Licensed Clinical Social Worker

## 2022-10-30 DIAGNOSIS — F6381 Intermittent explosive disorder: Secondary | ICD-10-CM
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
## 2022-11-14 ENCOUNTER — Encounter (HOSPITAL_COMMUNITY): Payer: Self-pay | Admitting: Student in an Organized Health Care Education/Training Program

## 2022-11-14 ENCOUNTER — Telehealth (INDEPENDENT_AMBULATORY_CARE_PROVIDER_SITE_OTHER): Payer: Medicaid Other | Admitting: Student in an Organized Health Care Education/Training Program

## 2022-11-14 DIAGNOSIS — F331 Major depressive disorder, recurrent, moderate: Secondary | ICD-10-CM | POA: Diagnosis not present

## 2022-11-14 DIAGNOSIS — F411 Generalized anxiety disorder: Secondary | ICD-10-CM

## 2022-11-14 MED ORDER — TRAZODONE HCL 50 MG PO TABS: 50.0000 mg | ORAL_TABLET | Freq: Every day | ORAL | 1 refills | Status: DC

## 2022-11-14 MED ORDER — FLUOXETINE HCL 20 MG PO CAPS: 20.0000 mg | ORAL_CAPSULE | Freq: Every day | ORAL | 1 refills | Status: DC

## 2022-11-14 NOTE — Progress Notes (Signed)
BH MD/PA/NP OP Progress Note  Virtual Visit via Video Note  I connected with Troy Phillips on 11/14/22 at  8:30 AM EDT by a video enabled telemedicine application and verified that I am speaking with the correct person using two identifiers.  Location: Patient: Home Provider: [facility]   I discussed the limitations of evaluation and management by telemedicine and the availability of in person appointments. The patient expressed understanding and agreed to proceed.   11/14/2022 8:39 AM Troy Phillips  MRN:  [id number]  Chief Complaint:  Chief Complaint  Patient presents with   Follow-up   Medication Refill   HPI:  Troy Phillips is a 37 yr old male who presents for follow up and medication management. PPHx is significant for MDD, GAD, and Intermittent Explosive Disorder.    He reports that he has continued to do well since our last appointment.  He reports still enjoying time with his grandkids over their summer break.  He reports his sleep continues to be good but that because of this he is had more energy and felt better.  Discussed with him that we would not make any changes to his medications at this time he was agreeable with this.  He reports no SI, HI, or AVH.  He reports sleep is good.  He reports appetite is doing good.  He reports no other concerns at present.  He will return for follow-up approximately 3 months.   Visit Diagnosis:    ICD-10-CM   1. Moderate episode of recurrent major depressive disorder (HCC)  F33.1 traZODone (DESYREL) 50 MG tablet    FLUoxetine (PROZAC) 20 MG capsule    2. GAD (generalized anxiety disorder)  F41.1 FLUoxetine (PROZAC) 20 MG capsule       Past Psychiatric History: MDD, GAD, and Intermittent Explosive Disorder.   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 No pertinent surgical history.  Family  Psychiatric History: Father - patient is unsure of psychiatric diagnoses but states that his father was on many medications   Family History:  Family History  Problem Relation Age of Onset   Heart attack Father    Heart disease Maternal Grandmother    Hypertension Other     Social History:  Social History   Socioeconomic History   Marital status: Single    Spouse name: Not on file   Number of children: Not on file   Years of education: Not on file   Highest education level: Not on file  Occupational History   Not on file  Tobacco Use   Smoking status: Every Day    Current packs/day: 0.50    Types: Cigarettes   Smokeless tobacco: Never  Vaping Use   Vaping status: Never Used  Substance and Sexual Activity   Alcohol use: Not Currently   Drug use: Not Currently    Types: Marijuana    Comment: Has not smoked since March 1st 2024 current date is March 22nd 2024   Sexual activity: Not Currently    Partners: Female  Other Topics Concern   Not on file  Social History Narrative   Not on file   Social Determinants of Health   Financial Resource Strain: Low Risk  (08/30/2021)   Overall Financial Resource Strain (CARDIA)    Difficulty of Paying Living  Expenses: Not hard at all  Food Insecurity: No Food Insecurity (08/30/2021)   Hunger Vital Sign    Worried About Running Out of Food in the Last Year: Never true    Ran Out of Food in the Last Year: Never true  Transportation Needs: No Transportation Needs (08/30/2021)   PRAPARE - Administrator, Civil Service (Medical): No    Lack of Transportation (Non-Medical): No  Physical Activity: Sufficiently Active (08/30/2021)   Exercise Vital Sign    Days of Exercise per Week: 7 days    Minutes of Exercise per Session: 60 min  Stress: No Stress Concern Present (06/20/2022)   [employer] of Occupational Health - Occupational Stress Questionnaire    Feeling of Stress : Not at all  Social Connections: Moderately Isolated  (07/14/2022)   Social Connection and Isolation Panel [NHANES]    Frequency of Communication with Friends and Family: More than three times a week    Frequency of Social Gatherings with Friends and Family: Twice a week    Attends Religious Services: 1 to 4 times per year    Active Member of [employer] or Organizations: No    Attends Engineer, structural: Never    Marital Status: Separated    Allergies: No Known Allergies  Metabolic Disorder Labs: No results found for: "HGBA1C", "MPG" No results found for: "PROLACTIN" No results found for: "CHOL", "TRIG", "HDL", "CHOLHDL", "VLDL", "LDLCALC" No results found for: "TSH"  Therapeutic Level Labs: No results found for: "LITHIUM" No results found for: "VALPROATE" No results found for: "CBMZ"  Current Medications: Current Outpatient Medications  Medication Sig Dispense Refill   albuterol (PROVENTIL HFA;VENTOLIN HFA) 108 (90 Base) MCG/ACT inhaler Inhale 2 puffs into the lungs every 6 (six) hours as needed for wheezing or shortness of breath.     diphenhydrAMINE (BENADRYL) 25 MG tablet Take 1 tablet (25 mg total) by mouth every 6 (six) hours as needed. (Patient taking differently: Take 25 mg by mouth every 6 (six) hours as needed for itching. ) 30 tablet 0   FLUoxetine (PROZAC) 20 MG capsule Take 1 capsule (20 mg total) by mouth daily. 30 capsule 1   ibuprofen (ADVIL,MOTRIN) 800 MG tablet Take 1 tablet (800 mg total) by mouth 3 (three) times daily. (Patient not taking: Reported on 02/20/2020) 21 tablet 0   ondansetron (ZOFRAN ODT) 4 MG disintegrating tablet Take 1 tablet (4 mg total) by mouth every 8 (eight) hours as needed for nausea or vomiting. (Patient not taking: Reported on 02/20/2020) 20 tablet 0   traZODone (DESYREL) 50 MG tablet Take 1 tablet (50 mg total) by mouth at bedtime. If sleep issues continue to occur, patient to take an additional tablet as needed. 30 tablet 1   No current facility-administered medications for this visit.      Musculoskeletal: Strength & Muscle Tone: within normal limits Gait & Station: normal Patient leans: N/A  Psychiatric Specialty Exam: Review of Systems  Respiratory:  Negative for cough and shortness of breath.   Cardiovascular:  Negative for chest pain.  Gastrointestinal:  Negative for abdominal pain, constipation, diarrhea, nausea and vomiting.  Neurological:  Negative for weakness and headaches.  Psychiatric/Behavioral:  Negative for dysphoric mood, hallucinations, sleep disturbance and suicidal ideas. The patient is not nervous/anxious.     There were no vitals taken for this visit.There is no height or weight on file to calculate BMI.  General Appearance: Casual and Fairly Groomed  Eye Contact:  Good  Speech:  Clear  and Coherent and Normal Rate  Volume:  Normal  Mood:  Euthymic  Affect:  Appropriate and Congruent  Thought Process:  Coherent and Goal Directed  Orientation:  Full (Time, Place, and Person)  Thought Content: WDL and Logical   Suicidal Thoughts:  No  Homicidal Thoughts:  No  Memory:  Immediate;   Good Recent;   Good  Judgement:  Good  Insight:  Good  Psychomotor Activity:  Normal  Concentration:  Concentration: Good and Attention Span: Good  Recall:  Good  Fund of Knowledge: Good  Language: Good  Akathisia:  Negative  Handed:  Right  AIMS (if indicated): not done  Assets:  Communication Skills Desire for Improvement Housing Resilience Social Support  ADL's:  Intact  Cognition: WNL  Sleep:  Good   Screenings: GAD-7    Advertising copywriter from 09/08/2022 in [facility] Counselor from 08/11/2022 in [facility] Counselor from 07/14/2022 in [facility] Counselor from 06/20/2022 in [facility] Counselor from 05/27/2022 in [facility]  Total GAD-7 Score 3 3 3 4 3       [phone]    Flowsheet Row Counselor  from 09/08/2022 in [facility] Counselor from 08/11/2022 in [facility] Counselor from 07/14/2022 in [facility] Counselor from 06/20/2022 in [facility] Counselor from 05/27/2022 in [place]  PHQ-2 Total Score 1 2 1 2 1   PHQ-9 Total Score 4 6 5 3 8       Flowsheet Row ED from 01/01/2022 in [facility] Department at [facility] Office Visit from 11/12/2021 in [facility] Office Visit from 09/06/2021 in [facility]  C-SSRS RISK CATEGORY No Risk No Risk No Risk        Assessment and Plan:  Troy Phillips is a 37 yr old male who presents for follow up and medication management.  PPHx is significant for MDD, GAD, and Intermittent Explosive Disorder.     Angeldejesus continues to do well with his medications.  He is reporting good sleep and subsequently improved energy.  We will not make any changes to his medications at this time.  Refills were sent in.  He will return for follow-up in approximately 3 months.   MDD, Recurrent  GAD: -Continue Prozac 20 mg daily for depression and anxiety. 30 tablets with 1 refill. -Continue Trazodone 50 mg QHS for sleep.  30 tablets with 1 refill.    Collaboration of Care: Collaboration of Care: Other provider involved in patient's care [facility] Therapist  Patient/Guardian was advised Release of Information must be obtained prior to any record release in order to collaborate their care with an outside provider. Patient/Guardian was advised if they have not already done so to contact the registration department to sign all necessary forms in order for [place] to release information regarding their care.   Consent: Patient/Guardian gives verbal consent for treatment and assignment of benefits for services provided during this visit. Patient/Guardian  expressed understanding and agreed to proceed.    Lauro Franklin, MD 11/14/2022, 8:39 AM  Follow Up Instructions:    I discussed the assessment and treatment plan with the patient. The patient was provided an opportunity to ask questions and all were answered. The patient agreed with the plan and demonstrated an understanding of the instructions.   The patient was  advised to call back or seek an in-person evaluation if the symptoms worsen or if the condition fails to improve as anticipated.  I provided 5 minutes of non-face-to-face time during this encounter.   Lauro Franklin, MD

## 2022-11-28 ENCOUNTER — Encounter (HOSPITAL_COMMUNITY): Payer: Self-pay

## 2022-11-28 ENCOUNTER — Ambulatory Visit (HOSPITAL_COMMUNITY): Payer: Medicaid Other | Admitting: Licensed Clinical Social Worker

## 2022-12-10 ENCOUNTER — Ambulatory Visit (INDEPENDENT_AMBULATORY_CARE_PROVIDER_SITE_OTHER): Payer: Medicaid Other | Admitting: Licensed Clinical Social Worker

## 2022-12-10 DIAGNOSIS — F6381 Intermittent explosive disorder: Secondary | ICD-10-CM | POA: Diagnosis not present

## 2022-12-10 NOTE — Progress Notes (Signed)
THERAPIST PROGRESS NOTE  Session Time: 30  Participation Level: Active  Behavioral Response: CasualAlertAnxious and Depressed  Type of Therapy: Individual Therapy  Treatment Goals addressed:  Active     Anxiety Disorder CCP Problem  1 GAD      STG: Patient will attend at least 80% of scheduled PHP sessions (Not Applicable)     Start:  08/30/21    Expected End:  02/27/22    Resolved:  11/05/21      STG: Patient will complete at least 80% of assigned homework (Completed/Met)     Start:  08/30/21    Expected End:  11/28/22    Resolved:  09/08/22    Goal Note     Pt has no missed session to date in 1 year of coming to therapy          STG: Patient will practice problem solving skills 3 times per week for the next 4 weeks (Progressing)     Start:  08/30/21    Expected End:  02/27/23         Discuss risks and benefits of medication treatment options for this problem and prescribe as indicated (Completed)     Start:  08/30/21    End:  11/05/21      Encourage patient to take psychotropic medication as prescribed (Completed)     Start:  08/30/21    End:  12/17/21      Review results of GAD-7 with the patient to track progress (Completed)     Start:  08/30/21    End:  11/05/21      Work with patient to track symptoms, triggers and/or skill use through a mood chart, diary card, or journal (Completed)     Start:  08/30/21    End:  12/17/21      Perform psychoeducation regarding anxiety disorders (Completed)     Start:  08/30/21    End:  11/05/21      Provide patient with educational information and reading material on anxiety, its causes, and symptoms (Completed)     Start:  08/30/21    End:  12/17/21      Work with patient to identify the major components of a recent episode of anxiety: physical symptoms, major thoughts and images, and major behaviors they experienced (Completed)     Start:  08/30/21    End:  12/17/21        Depression CCP Problem  1  MDD      LTG: Troy Phillips WILL SCORE LESS THAN 10 ON THE PATIENT HEALTH QUESTIONNAIRE (PHQ-9) (Completed/Met)     Start:  08/30/21    Expected End:  03/28/22    Resolved:  02/18/22      STG: Troy Phillips WILL ATTEND AT LEAST 80% OF SCHEDULED PHP SESSIONS (Not Applicable)     Start:  08/30/21    Expected End:  10/16/21    Resolved:  11/05/21      STG: Troy Phillips WILL PARTICIPATE IN AT LEAST 80% OF SCHEDULED INDIVIDUAL PSYCHOTHERAPY SESSIONS (Completed/Met)     Start:  08/30/21    Expected End:  11/28/22    Resolved:  09/08/22    Goal Note     Pt has no missed session to date in 1 year of coming to therapy          STG: Troy Phillips WILL COMPLETE AT LEAST 80% OF ASSIGNED HOMEWORK (Progressing)     Start:  08/30/21    Expected End:  02/27/23  WORK WITH Troy Phillips TO TRACK SYMPTOMS, TRIGGERS AND/OR SKILL USE THROUGH A MOOD CHART, DIARY CARD, OR JOURNAL (Completed)     Start:  08/30/21    End:  11/05/21      ENCOURAGE Troy Phillips TO PARTICIPATE IN RECOVERY PEER SUPPORT ACTIVITIES WEEKLY (Completed)     Start:  08/30/21    End:  12/17/21      Administer the PHQ-9 or MADRS weekly for 4 weeks (Completed)     Start:  08/30/21    End:  11/05/21      PROVIDE Troy Phillips WITH EDUCATIONAL INFORMATION AND READING MATERIAL ON DISSOCIATION, ITS CAUSES, AND SYMPTOMS (Completed)     Start:  08/30/21    End:  12/17/21      WORK WITH Troy Phillips TO IDENTIFY THE MAJOR COMPONENTS OF A RECENT EPISODE OF DEPRESSION: PHYSICAL SYMPTOMS, MAJOR THOUGHTS AND IMAGES, AND MAJOR BEHAVIORS THEY EXPERIENCED (Completed)     Start:  08/30/21    End:  12/17/21      WORK WITH Troy Phillips TO IDENTIFY THEIR 3 PERSONAL GOALS FOR MANAGING DEPRESSION SYMPTOMS AND ADD TO THIS PLAN (Completed)     Start:  08/30/21    End:  12/17/21      EDUCATE Troy Phillips ON COGNITIVE DISTORTIONS AND THE RATIONALE FOR TREATMENT OF DEPRESSION (Completed)     Start:  08/30/21    End:  12/17/21         ProgressTowards Goals:  Progressing  Interventions: Motivational Interviewing and Supportive  Summary: Troy Phillips is a 37 y.o. male who presents with anxious mood\affect.  Patient was pleasant, cooperative, maintained good eye contact.  He engaged well in therapy session was dressed casually.  Patient reports "everything has been going well".  Patient states today his primary stressors grief and loss.  Patient reports yesterday he lost his uncle to a sudden heart attack.  Patient reports that he has been trying to support his mother going through her grief and loss while also processing his own grief and loss.  Patient reports that overall medications have been beneficial for him.  Patient states he is taking them 7 out of 7 days/week.  Patient also reports good news that he will be getting off of probation in the next 120 days.   Suicidal/Homicidal: Nowithout intent/plan  Therapist Response:   Intervention\plan: LCSW educated patient on the stages of grief and loss.  LCSW used supportive therapy for praise and encouragement.  LCSW psycho analytic therapy for patient to express thoughts, feelings and emotions in session utilizing free association.  LCSW educated patient on taking medications consistently and monitoring side effects by journaling.    Plan: Return again in 4 weeks.  Diagnosis: No diagnosis found.  Collaboration of Care: Other None today   Patient/Guardian was advised Release of Information must be obtained prior to any record release in order to collaborate their care with an outside provider. Patient/Guardian was advised if they have not already done so to contact the registration department to sign all necessary forms in order for [place] to release information regarding their care.   Consent: Patient/Guardian gives verbal consent for treatment and assignment of benefits for services provided during this visit. Patient/Guardian expressed understanding and agreed to proceed.   [name],  LCSW 12/10/2022

## 2023-01-02 ENCOUNTER — Ambulatory Visit (INDEPENDENT_AMBULATORY_CARE_PROVIDER_SITE_OTHER): Payer: Medicaid Other | Admitting: Licensed Clinical Social Worker

## 2023-01-02 DIAGNOSIS — F6381 Intermittent explosive disorder: Secondary | ICD-10-CM

## 2023-01-02 NOTE — Progress Notes (Signed)
THERAPIST PROGRESS NOTE  Virtual Visit via Video Note  I connected with Troy Phillips on 01/02/23 at 10:00 AM EDT by a video enabled telemedicine application and verified that I am speaking with the correct person using two identifiers.  Location: Patient: [facility]  Provider: Providers Home    I discussed the limitations of evaluation and management by telemedicine and the availability of in person appointments. The patient expressed understanding and agreed to proceed.    I discussed the assessment and treatment plan with the patient. The patient was provided an opportunity to ask questions and all were answered. The patient agreed with the plan and demonstrated an understanding of the instructions.   The patient was advised to call back or seek an in-person evaluation if the symptoms worsen or if the condition fails to improve as anticipated.  I provided 30 minutes of non-face-to-face time during this encounter.   [name], LCSW   Participation Level: Active  Behavioral Response: CasualAlertEuthymic  Type of Therapy: Individual Therapy  Treatment Goals addressed:  Active     Anxiety Disorder CCP Problem  1 GAD      STG: Patient will attend at least 80% of scheduled PHP sessions (Not Applicable)     Start:  08/30/21    Expected End:  02/27/22    Resolved:  11/05/21      STG: Patient will complete at least 80% of assigned homework (Completed/Met)     Start:  08/30/21    Expected End:  11/28/22    Resolved:  09/08/22    Goal Note     Pt has no missed session to date in 1 year of coming to therapy          STG: Patient will practice problem solving skills 3 times per week for the next 4 weeks (Progressing)     Start:  08/30/21    Expected End:  02/27/23         Discuss risks and benefits of medication treatment options for this problem and prescribe as indicated (Completed)     Start:  08/30/21    End:  11/05/21      Encourage patient to  take psychotropic medication as prescribed (Completed)     Start:  08/30/21    End:  12/17/21      Review results of GAD-7 with the patient to track progress (Completed)     Start:  08/30/21    End:  11/05/21      Work with patient to track symptoms, triggers and/or skill use through a mood chart, diary card, or journal (Completed)     Start:  08/30/21    End:  12/17/21      Perform psychoeducation regarding anxiety disorders (Completed)     Start:  08/30/21    End:  11/05/21      Provide patient with educational information and reading material on anxiety, its causes, and symptoms (Completed)     Start:  08/30/21    End:  12/17/21      Work with patient to identify the major components of a recent episode of anxiety: physical symptoms, major thoughts and images, and major behaviors they experienced (Completed)     Start:  08/30/21    End:  12/17/21        Depression CCP Problem  1 MDD      LTG: [name] WILL SCORE LESS THAN 10 ON THE PATIENT HEALTH QUESTIONNAIRE (PHQ-9) (Completed/Met)     Start:  08/30/21    Expected End:  03/28/22    Resolved:  02/18/22      STG: [name] WILL ATTEND AT LEAST 80% OF SCHEDULED PHP SESSIONS (Not Applicable)     Start:  08/30/21    Expected End:  10/16/21    Resolved:  11/05/21      STG: [name] WILL PARTICIPATE IN AT LEAST 80% OF SCHEDULED INDIVIDUAL PSYCHOTHERAPY SESSIONS (Completed/Met)     Start:  08/30/21    Expected End:  11/28/22    Resolved:  09/08/22    Goal Note     Pt has no missed session to date in 1 year of coming to therapy          STG: Aidyn WILL COMPLETE AT LEAST 80% OF ASSIGNED HOMEWORK (Progressing)     Start:  08/30/21    Expected End:  02/27/23         WORK WITH Alesandro TO TRACK SYMPTOMS, TRIGGERS AND/OR SKILL USE THROUGH A MOOD CHART, DIARY CARD, OR JOURNAL (Completed)     Start:  08/30/21    End:  11/05/21      ENCOURAGE Trace TO PARTICIPATE IN RECOVERY PEER SUPPORT ACTIVITIES WEEKLY  (Completed)     Start:  08/30/21    End:  12/17/21      Administer the PHQ-9 or MADRS weekly for 4 weeks (Completed)     Start:  08/30/21    End:  11/05/21      PROVIDE [name] WITH EDUCATIONAL INFORMATION AND READING MATERIAL ON DISSOCIATION, ITS CAUSES, AND SYMPTOMS (Completed)     Start:  08/30/21    End:  12/17/21      WORK WITH [name] TO IDENTIFY THE MAJOR COMPONENTS OF A RECENT EPISODE OF DEPRESSION: PHYSICAL SYMPTOMS, MAJOR THOUGHTS AND IMAGES, AND MAJOR BEHAVIORS THEY EXPERIENCED (Completed)     Start:  08/30/21    End:  12/17/21      WORK WITH [name] TO IDENTIFY THEIR 3 PERSONAL GOALS FOR MANAGING DEPRESSION SYMPTOMS AND ADD TO THIS PLAN (Completed)     Start:  08/30/21    End:  12/17/21      EDUCATE [name] ON COGNITIVE DISTORTIONS AND THE RATIONALE FOR TREATMENT OF DEPRESSION (Completed)     Start:  08/30/21    End:  12/17/21         ProgressTowards Goals: Progressing  Interventions: Supportive  Suicidal/Homicidal: Nowithout intent/plan  Therapist Response:    Algis was alert and oriented x 5. He was dressed casually and engaged well in therapy session. Pt was pleasant, cooperative, and maintained good eye contact. He presented with euthymic mood/affect.    Pt reports primary stressor as legal. He reports a letter sent out that LCSW and MD signed was not up to the standards of court. LCSW stated that a document with the information needed will need to provide from the probation officer or the court system as the letter was deemed to be inadequate for use. LCSW provided pt email to pt to provide to probation officer to get further information.    Interventions/Plan: LCSW used solution focused therapy to provide pt with email and contact information for provider to get in contact with probation officer. LCSW used psychoanalytic therapy for pt to express thoughts, feeling and emotions in session LCSW used supportive therapy for praise and encouragement.      Plan: Return again in 4 weeks.  Diagnosis: Intermittent explosive disorder in adult  Collaboration of Care: Other None today   Patient/Guardian was advised Release of  Information must be obtained prior to any record release in order to collaborate their care with an outside provider. Patient/Guardian was advised if they have not already done so to contact the registration department to sign all necessary forms in order for [place] to release information regarding their care.   Consent: Patient/Guardian gives verbal consent for treatment and assignment of benefits for services provided during this visit. Patient/Guardian expressed understanding and agreed to proceed.   [name], LCSW 01/02/2023

## 2023-01-12 ENCOUNTER — Telehealth (HOSPITAL_COMMUNITY): Payer: Self-pay | Admitting: Licensed Clinical Social Worker

## 2023-01-12 NOTE — Telephone Encounter (Signed)
LCSW received email from Jerel Shepherd at [facility] [employer].  Patient and LCSW discussed a letter to the court describing diagnosis, compliance, and medication management at [facility] and last therapy session.  LCSW sent email back to Jerel Shepherd at the [employer] with patient's verbal consent for court mandated letter.

## 2023-02-13 ENCOUNTER — Telehealth (INDEPENDENT_AMBULATORY_CARE_PROVIDER_SITE_OTHER): Payer: Medicaid Other | Admitting: Student in an Organized Health Care Education/Training Program

## 2023-02-13 ENCOUNTER — Encounter (HOSPITAL_COMMUNITY): Payer: Self-pay | Admitting: Student in an Organized Health Care Education/Training Program

## 2023-02-13 DIAGNOSIS — F331 Major depressive disorder, recurrent, moderate: Secondary | ICD-10-CM

## 2023-02-13 DIAGNOSIS — F411 Generalized anxiety disorder: Secondary | ICD-10-CM

## 2023-02-13 MED ORDER — FLUOXETINE HCL 20 MG PO CAPS: 20.0000 mg | ORAL_CAPSULE | Freq: Every day | ORAL | 2 refills | Status: AC

## 2023-02-13 MED ORDER — TRAZODONE HCL 50 MG PO TABS: 50.0000 mg | ORAL_TABLET | Freq: Every day | ORAL | 2 refills | Status: AC

## 2023-02-13 NOTE — Progress Notes (Addendum)
BH MD/PA/NP OP Progress Note  Virtual Visit via Video Note  I connected with Troy Phillips on 02/13/23 at  9:00 AM EST by a video enabled telemedicine application and verified that I am speaking with the correct person using two identifiers.  Location: Patient: Home  Provider: [facility]   I discussed the limitations of evaluation and management by telemedicine and the availability of in person appointments. The patient expressed understanding and agreed to proceed.   02/13/2023 9:12 AM Troy Phillips  MRN:  [id number]  Chief Complaint:  Chief Complaint  Patient presents with   Follow-up   Medication Refill   HPI:  Troy Phillips is a 37 yr old male who presents for follow up and medication management. PPHx is significant for MDD, GAD, and Intermittent Explosive Disorder.    He reports that he continues to do well.  He reports he is looking forward to Thanksgiving in 2 weeks.  He reports so far avoiding any of the "viral crud" that has been going around but does report that his daughter did have something last week.  He reports no side effects to his medications.  Discussed with him that since he continues to do well we would not make any changes at this time and he was agreeable to this.  He reports no SI, HI, or AVH.  He reports sleep is good.  He reports appetite is doing good.  He reports no other concerns at present.  He will return for follow-up in approximately 3 months.   Visit Diagnosis:    ICD-10-CM   1. Moderate episode of recurrent major depressive disorder (HCC)  F33.1 FLUoxetine (PROZAC) 20 MG capsule    traZODone (DESYREL) 50 MG tablet    2. GAD (generalized anxiety disorder)  F41.1 FLUoxetine (PROZAC) 20 MG capsule        Past Psychiatric History: MDD, GAD, and Intermittent Explosive Disorder.   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 No pertinent surgical history.  Family Psychiatric History: Father - patient is unsure of psychiatric diagnoses but states that his father was on many medications   Family History:  Family History  Problem Relation Age of Onset   Heart attack Father    Heart disease Maternal Grandmother    Hypertension Other     Social History:  Social History   Socioeconomic History   Marital status: Single    Spouse name: Not on file   Number of children: Not on file   Years of education: Not on file   Highest education level: Not on file  Occupational History   Not on file  Tobacco Use   Smoking status: Every Day    Current packs/day: 0.50    Types: Cigarettes   Smokeless tobacco: Never  Vaping Use   Vaping status: Never Used  Substance and Sexual Activity   Alcohol use: Not Currently   Drug use: Not Currently    Types: Marijuana    Comment: Has not smoked since March 1st 2024 current date is March 22nd 2024   Sexual activity: Not Currently    Partners: Female  Other Topics Concern   Not on file  Social History Narrative   Not on file   Social Determinants of Health   Financial Resource Strain: Low Risk  (  08/30/2021)   Overall Financial Resource Strain (CARDIA)    Difficulty of Paying Living Expenses: Not hard at all  Food Insecurity: No Food Insecurity (08/30/2021)   Hunger Vital Sign    Worried About Running Out of Food in the Last Year: Never true    Ran Out of Food in the Last Year: Never true  Transportation Needs: No Transportation Needs (08/30/2021)   PRAPARE - Administrator, Civil Service (Medical): No    Lack of Transportation (Non-Medical): No  Physical Activity: Sufficiently Active (08/30/2021)   Exercise Vital Sign    Days of Exercise per Week: 7 days    Minutes of Exercise per Session: 60 min  Stress: No Stress Concern Present (06/20/2022)   [employer] of Occupational Health - Occupational Stress Questionnaire    Feeling of  Stress : Not at all  Social Connections: Moderately Isolated (07/14/2022)   Social Connection and Isolation Panel [NHANES]    Frequency of Communication with Friends and Family: More than three times a week    Frequency of Social Gatherings with Friends and Family: Twice a week    Attends Religious Services: 1 to 4 times per year    Active Member of [employer] or Organizations: No    Attends Engineer, structural: Never    Marital Status: Separated    Allergies: No Known Allergies  Metabolic Disorder Labs: No results found for: "HGBA1C", "MPG" No results found for: "PROLACTIN" No results found for: "CHOL", "TRIG", "HDL", "CHOLHDL", "VLDL", "LDLCALC" No results found for: "TSH"  Therapeutic Level Labs: No results found for: "LITHIUM" No results found for: "VALPROATE" No results found for: "CBMZ"  Current Medications: Current Outpatient Medications  Medication Sig Dispense Refill   albuterol (PROVENTIL HFA;VENTOLIN HFA) 108 (90 Base) MCG/ACT inhaler Inhale 2 puffs into the lungs every 6 (six) hours as needed for wheezing or shortness of breath.     diphenhydrAMINE (BENADRYL) 25 MG tablet Take 1 tablet (25 mg total) by mouth every 6 (six) hours as needed. (Patient taking differently: Take 25 mg by mouth every 6 (six) hours as needed for itching. ) 30 tablet 0   FLUoxetine (PROZAC) 20 MG capsule Take 1 capsule (20 mg total) by mouth daily. 30 capsule 2   ibuprofen (ADVIL,MOTRIN) 800 MG tablet Take 1 tablet (800 mg total) by mouth 3 (three) times daily. (Patient not taking: Reported on 02/20/2020) 21 tablet 0   traZODone (DESYREL) 50 MG tablet Take 1 tablet (50 mg total) by mouth at bedtime. If sleep issues continue to occur, patient to take an additional tablet as needed. 30 tablet 2   No current facility-administered medications for this visit.     Musculoskeletal: Strength & Muscle Tone: within normal limits Gait & Station: normal Patient leans: N/A  Psychiatric Specialty  Exam: Review of Systems  Respiratory:  Negative for cough and shortness of breath.   Cardiovascular:  Negative for chest pain.  Gastrointestinal:  Negative for abdominal pain, constipation, diarrhea, nausea and vomiting.  Neurological:  Negative for weakness and headaches.  Psychiatric/Behavioral:  Negative for dysphoric mood, hallucinations and sleep disturbance. The patient is not nervous/anxious.     There were no vitals taken for this visit.There is no height or weight on file to calculate BMI.  General Appearance: Casual and Fairly Groomed  Eye Contact:  Good  Speech:  Clear and Coherent and Normal Rate  Volume:  Normal  Mood:   upbeat  Affect:  Appropriate and Congruent  Thought Process:  Coherent and Goal Directed  Orientation:  Full (Time, Place, and Person)  Thought Content: WDL and Logical   Suicidal Thoughts:  No  Homicidal Thoughts:  No  Memory:  Immediate;   Good Recent;   Good  Judgement:  Good  Insight:  Good  Psychomotor Activity:  Normal  Concentration:  Concentration: Good and Attention Span: Good  Recall:  Good  Fund of Knowledge: Good  Language: Good  Akathisia:  Negative  Handed:  Right  AIMS (if indicated): not done  Assets:  Communication Skills Desire for Improvement Housing Resilience Social Support  ADL's:  Intact  Cognition: WNL  Sleep:  Good   Screenings: GAD-7    Advertising copywriter from 09/08/2022 in [facility] Counselor from 08/11/2022 in [facility] Counselor from 07/14/2022 in [facility] Counselor from 06/20/2022 in [facility] Counselor from 05/27/2022 in [facility]  Total GAD-7 Score 3 3 3 4 3       PHQ2-9    Flowsheet Row Counselor from 09/08/2022 in [facility] Counselor from 08/11/2022 in [facility] Counselor from 07/14/2022 in  [facility] Counselor from 06/20/2022 in [facility] Counselor from 05/27/2022 in [place]  PHQ-2 Total Score 1 2 1 2 1   PHQ-9 Total Score 4 6 5 3 8       Flowsheet Row ED from 01/01/2022 in [facility] at [facility] Office Visit from 11/12/2021 in [facility] Office Visit from 09/06/2021 in [facility]  C-SSRS RISK CATEGORY No Risk No Risk No Risk        Assessment and Plan:  Troy Phillips is a 37 yr old male who presents for follow up and medication management.  PPHx is significant for MDD, GAD, and Intermittent Explosive Disorder.     Troy Phillips continues to do well with his medication regimen.  We will not make any changes to his medication at this time.  Refills were sent in.  He will return for follow-up approximately 3 months.   MDD, Recurrent  GAD: -Continue Prozac 20 mg daily for depression and anxiety. 30 tablets with 2 refills. -Continue Trazodone 50 mg QHS for sleep.  30 tablets with 2 refills.    Collaboration of Care: Collaboration of Care: Other provider involved in patient's care A[facility] Therapist  Patient/Guardian was advised Release of Information must be obtained prior to any record release in order to collaborate their care with an outside provider. Patient/Guardian was advised if they have not already done so to contact the registration department to sign all necessary forms in order for [place] to release information regarding their care.   Consent: Patient/Guardian gives verbal consent for treatment and assignment of benefits for services provided during this visit. Patient/Guardian expressed understanding and agreed to proceed.    Lauro Franklin, MD 02/13/2023, 9:12 AM  Follow Up Instructions:    I discussed the assessment and treatment plan with the patient. The patient was provided  an opportunity to ask questions and all were answered. The patient agreed with the plan and demonstrated an understanding of the instructions.   The patient was advised to call back or seek an in-person evaluation if the symptoms worsen or if the condition fails to improve as anticipated.  I provided 5 minutes of non-face-to-face time during this encounter.  Lauro Franklin, MD

## 2023-05-08 ENCOUNTER — Encounter (HOSPITAL_COMMUNITY): Payer: Medicaid Other | Admitting: Student in an Organized Health Care Education/Training Program

## 2023-05-08 NOTE — Progress Notes (Signed)
 This encounter was created in error - please disregard.

## 2023-05-08 NOTE — Progress Notes (Deleted)
 BH MD/PA/NP OP Progress Note  Virtual Visit via Video Note  I connected with Troy Phillips on 05/08/23 at 10:00 AM EST by a video enabled telemedicine application and verified that I am speaking with the correct person using two identifiers.  Location: Patient: Home *** Provider: [facility]   I discussed the limitations of evaluation and management by telemedicine and the availability of in person appointments. The patient expressed understanding and agreed to proceed.   05/08/2023 7:29 AM Troy Phillips  MRN:  [id number]  Chief Complaint:  No chief complaint on file.  HPI:  Troy Phillips is a 38 yr old male who presents for follow up and medication management. PPHx is significant for MDD, GAD, and Intermittent Explosive Disorder.    He reports ***   Visit Diagnosis:  No diagnosis found.     Past Psychiatric History: MDD, GAD, and Intermittent Explosive Disorder.   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  Family Psychiatric History: Father - patient is unsure of psychiatric diagnoses but states that his father was on many medications   Family History:  Family History  Problem Relation Age of Onset   Heart attack Father    Heart disease Maternal Grandmother    Hypertension Other     Social History:  Social History   Socioeconomic History   Marital status: Single    Spouse name: Not on file   Number of children: Not on file   Years of education: Not on file   Highest education level: Not on file  Occupational History   Not on file  Tobacco Use   Smoking status: Every Day    Current packs/day: 0.50    Types: Cigarettes   Smokeless tobacco: Never  Vaping Use   Vaping status: Never Used  Substance and Sexual Activity   Alcohol use: Not Currently   Drug use: Not Currently    Types: Marijuana     Comment: Has not smoked since March 1st 2024 current date is March 22nd 2024   Sexual activity: Not Currently    Partners: Female  Other Topics Concern   Not on file  Social History Narrative   Not on file   Social Drivers of Health   Financial Resource Strain: Low Risk  (08/30/2021)   Overall Financial Resource Strain (CARDIA)    Difficulty of Paying Living Expenses: Not hard at all  Food Insecurity: No Food Insecurity (08/30/2021)   Hunger Vital Sign    Worried About Running Out of Food in the Last Year: Never true    Ran Out of Food in the Last Year: Never true  Transportation Needs: No Transportation Needs (08/30/2021)   PRAPARE - Administrator, Civil Service (Medical): No    Lack of Transportation (Non-Medical): No  Physical Activity: Sufficiently Active (08/30/2021)   Exercise Vital Sign    Days of Exercise per Week: 7 days    Minutes of Exercise per Session: 60 min  Stress: No Stress Concern Present (06/20/2022)   [employer] of Occupational Health - Occupational Stress Questionnaire    Feeling of Stress : Not at all  Social Connections: Moderately Isolated (07/14/2022)   Social Connection and Isolation Panel [NHANES]    Frequency of Communication with Friends and Family: More  than three times a week    Frequency of Social Gatherings with Friends and Family: Twice a week    Attends Religious Services: 1 to 4 times per year    Active Member of [employer] or Organizations: No    Attends Engineer, Structural: Never    Marital Status: Separated    Allergies: No Known Allergies  Metabolic Disorder Labs: No results found for: HGBA1C, MPG No results found for: PROLACTIN No results found for: CHOL, TRIG, HDL, CHOLHDL, VLDL, LDLCALC No results found for: TSH  Therapeutic Level Labs: No results found for: LITHIUM No results found for: VALPROATE No results found for: CBMZ  Current Medications: Current Outpatient Medications   Medication Sig Dispense Refill   albuterol  (PROVENTIL  HFA;VENTOLIN  HFA) 108 (90 Base) MCG/ACT inhaler Inhale 2 puffs into the lungs every 6 (six) hours as needed for wheezing or shortness of breath.     diphenhydrAMINE  (BENADRYL ) 25 MG tablet Take 1 tablet (25 mg total) by mouth every 6 (six) hours as needed. (Patient taking differently: Take 25 mg by mouth every 6 (six) hours as needed for itching. ) 30 tablet 0   FLUoxetine  (PROZAC ) 20 MG capsule Take 1 capsule (20 mg total) by mouth daily. 30 capsule 2   ibuprofen  (ADVIL ,MOTRIN ) 800 MG tablet Take 1 tablet (800 mg total) by mouth 3 (three) times daily. (Patient not taking: Reported on 02/20/2020) 21 tablet 0   traZODone  (DESYREL ) 50 MG tablet Take 1 tablet (50 mg total) by mouth at bedtime. If sleep issues continue to occur, patient to take an additional tablet as needed. 30 tablet 2   No current facility-administered medications for this visit.     Musculoskeletal: Strength & Muscle Tone: within normal limits Gait & Station: normal Patient leans: N/A *** Psychiatric Specialty Exam: Review of Systems  There were no vitals taken for this visit.There is no height or weight on file to calculate BMI.  General Appearance: Casual and Fairly Groomed  Eye Contact:  Good  Speech:  Clear and Coherent and Normal Rate  Volume:  Normal  Mood:   upbeat  Affect:  Appropriate and Congruent  Thought Process:  Coherent and Goal Directed  Orientation:  Full (Time, Place, and Person)  Thought Content: WDL and Logical   Suicidal Thoughts:  No  Homicidal Thoughts:  No  Memory:  Immediate;   Good Recent;   Good  Judgement:  Good  Insight:  Good  Psychomotor Activity:  Normal  Concentration:  Concentration: Good and Attention Span: Good  Recall:  Good  Fund of Knowledge: Good  Language: Good  Akathisia:  Negative  Handed:  Right  AIMS (if indicated): not done  Assets:  Communication Skills Desire for Improvement Housing Resilience Social  Support  ADL's:  Intact  Cognition: WNL  Sleep:  Good   Screenings: GAD-7    Advertising Copywriter from 09/08/2022 in [facility] Counselor from 08/11/2022 in [facility] Counselor from 07/14/2022 in [facility] Counselor from 06/20/2022 in [facility] Counselor from 05/27/2022 in [facility]  Total GAD-7 Score 3 3 3 4 3       PHQ2-9    Flowsheet Row Counselor from 09/08/2022 in [facility] Counselor from 08/11/2022 in [facility] Counselor from 07/14/2022 in [facility] Counselor from 06/20/2022 in [facility] Counselor from 05/27/2022 in [facility]  PHQ-2 Total Score 1 2 1 2 1   PHQ-9 Total Score 4 6 5 3 8       Flowsheet Row ED from 01/01/2022 in [facility] at [facility] Visit from 11/12/2021 in [facility] Office Visit from 09/06/2021 in [facility]  C-SSRS RISK CATEGORY No Risk No Risk No Risk        Assessment and Plan:  Xayne L. Dozier is a 38 yr old male who presents for follow up and medication management.  PPHx is significant for MDD, GAD, and Intermittent Explosive Disorder.     Fernie ***   MDD, Recurrent  GAD: -Continue Prozac  20 mg daily for depression and anxiety. 30 tablets with 2 refills. -Continue Trazodone  50 mg QHS for sleep.  30 tablets with 2 refills.    Collaboration of Care: Collaboration of Care: Other provider involved in patient's care AEB [facility] Therapist  Patient/Guardian was advised Release of Information must be obtained prior to any record release in order to collaborate their care with an outside provider. Patient/Guardian was advised if they have not already done so to  contact the registration department to sign all necessary forms in order for us  to release information regarding their care.   Consent: Patient/Guardian gives verbal consent for treatment and assignment of benefits for services provided during this visit. Patient/Guardian expressed understanding and agreed to proceed.    Marsa GORMAN Rosser, MD 05/08/2023, 7:29 AM  Follow Up Instructions:    I discussed the assessment and treatment plan with the patient. The patient was provided an opportunity to ask questions and all were answered. The patient agreed with the plan and demonstrated an understanding of the instructions.   The patient was advised to call back or seek an in-person evaluation if the symptoms worsen or if the condition fails to improve as anticipated.  I provided *** minutes of non-face-to-face time during this encounter.   Marsa GORMAN Rosser, MD
